# Patient Record
Sex: Female | Born: 1960 | Race: Black or African American | Hispanic: No | Marital: Single | Smoking: Never smoker
Health system: Southern US, Community
[De-identification: ages and names within clinical notes are randomized; demographics above are authoritative.]

## PROBLEM LIST (undated history)

## (undated) DIAGNOSIS — L509 Urticaria, unspecified: Secondary | ICD-10-CM

## (undated) DIAGNOSIS — I8390 Asymptomatic varicose veins of unspecified lower extremity: Secondary | ICD-10-CM

## (undated) DIAGNOSIS — Z780 Asymptomatic menopausal state: Secondary | ICD-10-CM

## (undated) DIAGNOSIS — T7840XA Allergy, unspecified, initial encounter: Secondary | ICD-10-CM

## (undated) DIAGNOSIS — T783XXA Angioneurotic edema, initial encounter: Secondary | ICD-10-CM

## (undated) HISTORY — DX: Asymptomatic menopausal state: Z78.0

## (undated) HISTORY — DX: Urticaria, unspecified: L50.9

## (undated) HISTORY — DX: Angioneurotic edema, initial encounter: T78.3XXA

## (undated) HISTORY — DX: Allergy, unspecified, initial encounter: T78.40XA

## (undated) HISTORY — DX: Asymptomatic varicose veins of unspecified lower extremity: I83.90

---

## 1998-02-16 ENCOUNTER — Ambulatory Visit (HOSPITAL_COMMUNITY): Admission: RE | Admit: 1998-02-16 | Discharge: 1998-02-16 | Payer: Self-pay | Admitting: Internal Medicine

## 1998-09-28 ENCOUNTER — Other Ambulatory Visit: Admission: RE | Admit: 1998-09-28 | Discharge: 1998-09-28 | Payer: Self-pay | Admitting: Internal Medicine

## 1999-01-22 ENCOUNTER — Emergency Department (HOSPITAL_COMMUNITY): Admission: EM | Admit: 1999-01-22 | Discharge: 1999-01-22 | Payer: Self-pay | Admitting: Emergency Medicine

## 1999-09-29 ENCOUNTER — Other Ambulatory Visit: Admission: RE | Admit: 1999-09-29 | Discharge: 1999-09-29 | Payer: Self-pay | Admitting: Internal Medicine

## 2000-11-16 ENCOUNTER — Encounter: Admission: RE | Admit: 2000-11-16 | Discharge: 2000-11-16 | Payer: Self-pay | Admitting: Internal Medicine

## 2000-11-16 ENCOUNTER — Encounter: Payer: Self-pay | Admitting: Internal Medicine

## 2000-11-19 ENCOUNTER — Encounter: Payer: Self-pay | Admitting: Internal Medicine

## 2000-11-19 ENCOUNTER — Ambulatory Visit (HOSPITAL_COMMUNITY): Admission: RE | Admit: 2000-11-19 | Discharge: 2000-11-19 | Payer: Self-pay | Admitting: Internal Medicine

## 2000-12-11 ENCOUNTER — Other Ambulatory Visit: Admission: RE | Admit: 2000-12-11 | Discharge: 2000-12-11 | Payer: Self-pay | Admitting: Internal Medicine

## 2001-10-29 ENCOUNTER — Other Ambulatory Visit: Admission: RE | Admit: 2001-10-29 | Discharge: 2001-10-29 | Payer: Self-pay | Admitting: Family Medicine

## 2002-12-09 ENCOUNTER — Other Ambulatory Visit: Admission: RE | Admit: 2002-12-09 | Discharge: 2002-12-09 | Payer: Self-pay | Admitting: Family Medicine

## 2002-12-15 ENCOUNTER — Encounter: Payer: Self-pay | Admitting: Family Medicine

## 2002-12-15 ENCOUNTER — Encounter: Admission: RE | Admit: 2002-12-15 | Discharge: 2002-12-15 | Payer: Self-pay | Admitting: Family Medicine

## 2004-03-23 ENCOUNTER — Other Ambulatory Visit: Admission: RE | Admit: 2004-03-23 | Discharge: 2004-03-23 | Payer: Self-pay | Admitting: Family Medicine

## 2005-05-01 ENCOUNTER — Other Ambulatory Visit: Admission: RE | Admit: 2005-05-01 | Discharge: 2005-05-01 | Payer: Self-pay | Admitting: Internal Medicine

## 2006-01-18 ENCOUNTER — Encounter: Admission: RE | Admit: 2006-01-18 | Discharge: 2006-01-18 | Payer: Self-pay | Admitting: Internal Medicine

## 2006-02-13 ENCOUNTER — Other Ambulatory Visit: Admission: RE | Admit: 2006-02-13 | Discharge: 2006-02-13 | Payer: Self-pay | Admitting: Internal Medicine

## 2006-10-08 ENCOUNTER — Encounter: Admission: RE | Admit: 2006-10-08 | Discharge: 2006-10-08 | Payer: Self-pay | Admitting: Internal Medicine

## 2007-04-15 ENCOUNTER — Encounter: Admission: RE | Admit: 2007-04-15 | Discharge: 2007-04-15 | Payer: Self-pay | Admitting: Internal Medicine

## 2007-04-15 ENCOUNTER — Other Ambulatory Visit: Admission: RE | Admit: 2007-04-15 | Discharge: 2007-04-15 | Payer: Self-pay | Admitting: Internal Medicine

## 2008-02-12 ENCOUNTER — Encounter: Admission: RE | Admit: 2008-02-12 | Discharge: 2008-02-12 | Payer: Self-pay | Admitting: Internal Medicine

## 2008-02-14 ENCOUNTER — Encounter: Admission: RE | Admit: 2008-02-14 | Discharge: 2008-02-14 | Payer: Self-pay | Admitting: Internal Medicine

## 2008-04-09 ENCOUNTER — Ambulatory Visit: Payer: Self-pay | Admitting: Internal Medicine

## 2008-05-21 ENCOUNTER — Other Ambulatory Visit: Admission: RE | Admit: 2008-05-21 | Discharge: 2008-05-21 | Payer: Self-pay | Admitting: Internal Medicine

## 2008-05-21 ENCOUNTER — Ambulatory Visit: Payer: Self-pay | Admitting: Internal Medicine

## 2008-08-24 ENCOUNTER — Ambulatory Visit: Payer: Self-pay | Admitting: Internal Medicine

## 2008-10-05 ENCOUNTER — Ambulatory Visit: Payer: Self-pay | Admitting: Internal Medicine

## 2009-02-11 ENCOUNTER — Ambulatory Visit: Payer: Self-pay | Admitting: Internal Medicine

## 2009-04-05 ENCOUNTER — Encounter: Admission: RE | Admit: 2009-04-05 | Discharge: 2009-04-05 | Payer: Self-pay | Admitting: Internal Medicine

## 2009-08-02 ENCOUNTER — Ambulatory Visit: Payer: Self-pay | Admitting: Internal Medicine

## 2009-10-15 ENCOUNTER — Ambulatory Visit: Payer: Self-pay | Admitting: Internal Medicine

## 2010-03-07 ENCOUNTER — Ambulatory Visit: Payer: Self-pay | Admitting: Internal Medicine

## 2010-05-06 ENCOUNTER — Encounter: Admission: RE | Admit: 2010-05-06 | Discharge: 2010-05-06 | Payer: Self-pay | Admitting: Internal Medicine

## 2010-05-31 ENCOUNTER — Ambulatory Visit: Payer: Self-pay | Admitting: Internal Medicine

## 2010-05-31 ENCOUNTER — Ambulatory Visit: Admit: 2010-05-31 | Payer: Self-pay | Admitting: Internal Medicine

## 2010-06-27 ENCOUNTER — Encounter: Payer: Self-pay | Admitting: Internal Medicine

## 2010-08-09 ENCOUNTER — Ambulatory Visit (INDEPENDENT_AMBULATORY_CARE_PROVIDER_SITE_OTHER): Payer: BC Managed Care – PPO | Admitting: Internal Medicine

## 2010-08-09 DIAGNOSIS — J309 Allergic rhinitis, unspecified: Secondary | ICD-10-CM

## 2010-08-09 DIAGNOSIS — H6690 Otitis media, unspecified, unspecified ear: Secondary | ICD-10-CM

## 2010-08-09 DIAGNOSIS — J019 Acute sinusitis, unspecified: Secondary | ICD-10-CM

## 2011-01-27 ENCOUNTER — Telehealth: Payer: Self-pay

## 2011-01-27 DIAGNOSIS — N760 Acute vaginitis: Secondary | ICD-10-CM

## 2011-01-27 MED ORDER — FLUCONAZOLE 150 MG PO TABS: 150.0000 mg | ORAL_TABLET | Freq: Once | ORAL | Status: AC

## 2011-01-27 NOTE — Telephone Encounter (Signed)
Patient calls today with suspected yeast infection. Vaginal discharge with itching. Does not want to use OTC antifungals; requests pill given to her in the past. Per verbal order of Dr. Lenord Fellers, Diflucan 150 mg called to [facility]. Patient informed.

## 2011-03-06 ENCOUNTER — Ambulatory Visit (INDEPENDENT_AMBULATORY_CARE_PROVIDER_SITE_OTHER): Payer: BC Managed Care – PPO | Admitting: Internal Medicine

## 2011-03-06 ENCOUNTER — Other Ambulatory Visit: Payer: Self-pay

## 2011-03-06 ENCOUNTER — Encounter: Payer: Self-pay | Admitting: Internal Medicine

## 2011-03-06 VITALS — BP 118/62 | HR 68 | Temp 98.6°F | Ht 64.0 in | Wt 158.0 lb

## 2011-03-06 DIAGNOSIS — B9689 Other specified bacterial agents as the cause of diseases classified elsewhere: Secondary | ICD-10-CM

## 2011-03-06 DIAGNOSIS — N898 Other specified noninflammatory disorders of vagina: Secondary | ICD-10-CM

## 2011-03-06 DIAGNOSIS — A499 Bacterial infection, unspecified: Secondary | ICD-10-CM

## 2011-03-06 DIAGNOSIS — N9489 Other specified conditions associated with female genital organs and menstrual cycle: Secondary | ICD-10-CM

## 2011-03-06 DIAGNOSIS — N909 Noninflammatory disorder of vulva and perineum, unspecified: Secondary | ICD-10-CM

## 2011-03-06 DIAGNOSIS — N760 Acute vaginitis: Secondary | ICD-10-CM

## 2011-03-06 MED ORDER — CLOBETASOL PROPIONATE 0.05 % EX CREA: TOPICAL_CREAM | Freq: Two times a day (BID) | CUTANEOUS | Status: DC

## 2011-03-07 ENCOUNTER — Encounter: Payer: Self-pay | Admitting: Internal Medicine

## 2011-03-07 LAB — GC/CHLAMYDIA PROBE AMP, GENITAL: GC Probe Amp, Genital: NEGATIVE

## 2011-04-05 DIAGNOSIS — B9689 Other specified bacterial agents as the cause of diseases classified elsewhere: Secondary | ICD-10-CM | POA: Insufficient documentation

## 2011-04-05 DIAGNOSIS — N760 Acute vaginitis: Secondary | ICD-10-CM | POA: Insufficient documentation

## 2011-04-05 NOTE — Patient Instructions (Signed)
Take Flagyl twice daily as prescribed. Do not drink alcohol when taking Flagyl. Use Lotrisone cream and genital area twice daily

## 2011-04-05 NOTE — Progress Notes (Signed)
  Subjective:    Patient ID: Carrie Mosley, female    DOB: 1961-04-24, 50 y.o.   MRN: [id number]  HPI patient has irritation in genital area. Describes it as soreness. History of bacterial vaginosis. Some vaginal discharge.    Review of Systems     Objective:   Physical Exam apparent vaginal dryness. Erythema and irritation in labial area and introitus. Slight vaginal discharge. Wet prep reveals clue cells.        Assessment & Plan:  Vaginal dryness  Genital excoriations  Bacterial vaginosis  Plan: Lotrisone cream in genital area twice daily. Flagyl 500 mg by mouth twice daily for 7 days for bacterial vaginosis

## 2011-04-28 ENCOUNTER — Other Ambulatory Visit: Payer: Self-pay | Admitting: Internal Medicine

## 2011-04-28 DIAGNOSIS — Z1231 Encounter for screening mammogram for malignant neoplasm of breast: Secondary | ICD-10-CM

## 2011-05-12 ENCOUNTER — Ambulatory Visit
Admission: RE | Admit: 2011-05-12 | Discharge: 2011-05-12 | Disposition: A | Discharge status: Home / Self Care | Payer: BC Managed Care – PPO | Source: Ambulatory Visit | Attending: Internal Medicine | Admitting: Internal Medicine

## 2011-05-12 DIAGNOSIS — Z1231 Encounter for screening mammogram for malignant neoplasm of breast: Secondary | ICD-10-CM

## 2011-05-16 ENCOUNTER — Other Ambulatory Visit: Payer: Self-pay | Admitting: Internal Medicine

## 2011-05-16 DIAGNOSIS — R928 Other abnormal and inconclusive findings on diagnostic imaging of breast: Secondary | ICD-10-CM

## 2011-05-26 ENCOUNTER — Ambulatory Visit
Admission: RE | Admit: 2011-05-26 | Discharge: 2011-05-26 | Disposition: A | Discharge status: Home / Self Care | Payer: BC Managed Care – PPO | Source: Ambulatory Visit | Attending: Internal Medicine | Admitting: Internal Medicine

## 2011-05-26 DIAGNOSIS — R928 Other abnormal and inconclusive findings on diagnostic imaging of breast: Secondary | ICD-10-CM

## 2011-05-31 ENCOUNTER — Other Ambulatory Visit: Payer: BC Managed Care – PPO

## 2011-06-15 ENCOUNTER — Other Ambulatory Visit: Payer: BC Managed Care – PPO | Admitting: Internal Medicine

## 2011-06-16 ENCOUNTER — Encounter: Payer: Self-pay | Admitting: Internal Medicine

## 2011-06-16 ENCOUNTER — Ambulatory Visit (INDEPENDENT_AMBULATORY_CARE_PROVIDER_SITE_OTHER): Payer: BC Managed Care – PPO | Admitting: Internal Medicine

## 2011-06-16 VITALS — BP 106/74 | HR 72 | Temp 98.9°F | Ht 64.0 in | Wt 158.0 lb

## 2011-06-16 DIAGNOSIS — Z8639 Personal history of other endocrine, nutritional and metabolic disease: Secondary | ICD-10-CM

## 2011-06-16 DIAGNOSIS — Z87898 Personal history of other specified conditions: Secondary | ICD-10-CM

## 2011-06-16 DIAGNOSIS — Z0000 Encounter for general adult medical examination without abnormal findings: Secondary | ICD-10-CM

## 2011-06-16 DIAGNOSIS — Z8669 Personal history of other diseases of the nervous system and sense organs: Secondary | ICD-10-CM

## 2011-06-16 LAB — POCT URINALYSIS DIPSTICK
Blood, UA: NEGATIVE
Ketones, UA: NEGATIVE
Protein, UA: NEGATIVE
Spec Grav, UA: 1.01
Urobilinogen, UA: NEGATIVE

## 2011-06-17 LAB — LIPID PANEL
Cholesterol: 229 mg/dL — ABNORMAL HIGH (ref 0–200)
HDL: 81 mg/dL (ref >39)
Total CHOL/HDL Ratio: 2.8 Ratio
Triglycerides: 80 mg/dL (ref <150)
VLDL: 16 mg/dL (ref 0–40)

## 2011-06-17 LAB — CBC WITH DIFFERENTIAL/PLATELET
Basophils Absolute: 0 10*3/uL (ref 0.0–0.1)
Basophils Relative: 1 % (ref 0–1)
Eosinophils Absolute: 0.1 10*3/uL (ref 0.0–0.7)
Eosinophils Relative: 3 % (ref 0–5)
HCT: 44.6 % (ref 36.0–46.0)
MCH: 27.5 pg (ref 26.0–34.0)
MCHC: 33.2 g/dL (ref 30.0–36.0)
Monocytes Absolute: 0.3 10*3/uL (ref 0.1–1.0)
Neutro Abs: 2.2 10*3/uL (ref 1.7–7.7)
RDW: 13.4 % (ref 11.5–15.5)

## 2011-06-17 LAB — COMPREHENSIVE METABOLIC PANEL
AST: 16 U/L (ref 0–37)
Alkaline Phosphatase: 114 U/L (ref 39–117)
BUN: 14 mg/dL (ref 6–23)
Creat: 0.82 mg/dL (ref 0.50–1.10)
Potassium: 4 mEq/L (ref 3.5–5.3)
Total Bilirubin: 0.6 mg/dL (ref 0.3–1.2)

## 2011-06-23 ENCOUNTER — Encounter: Payer: Self-pay | Admitting: Internal Medicine

## 2011-08-07 ENCOUNTER — Encounter: Payer: Self-pay | Admitting: Internal Medicine

## 2011-08-07 NOTE — Patient Instructions (Signed)
Return in one year or as needed. 

## 2011-08-13 DIAGNOSIS — Z8639 Personal history of other endocrine, nutritional and metabolic disease: Secondary | ICD-10-CM | POA: Insufficient documentation

## 2011-08-13 DIAGNOSIS — Z8669 Personal history of other diseases of the nervous system and sense organs: Secondary | ICD-10-CM | POA: Insufficient documentation

## 2011-08-13 NOTE — Progress Notes (Signed)
  Subjective:    Patient ID: Carrie Mosley, female    DOB: April 20, 1961, 51 y.o.   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 History of kidney stone 1989. History of innocent cardiac murmur. History of occasional migraine headaches. History of vitamin D deficiency. Became menopausal in 2011. History of recurrent bacterial vaginosis. No known drug allergies.  Social history patient rarely drinks alcohol and does not smoke. She lives alone. Has never married. No children. One brother with diabetes. No sisters.  Family history Brother with diabetes. Mother died with apparent septicemia. Father died of heart failure in his 49s. He had diabetes and coronary artery disease.    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 Has seen Dr. Lenoria Farrier April 2012 he suggested that she had eustachian tube dysfunction. Recommended continue with Flonase and Zyrtec-D. Ear exam was negative at that time.  Patient was allergy tested in 2003 and there was no hypersensitivity to a screening panel of allergens. At that time a limited sinus CT was obtained showing no sinus disease.  History of superficial varicosities both legs     Objective:   Physical Exam  Nursing note and vitals reviewed. Constitutional: She is oriented to person, place, and time. She appears well-developed and well-nourished. No distress.  HENT:  Head: Atraumatic.  Right Ear: External ear normal.  Left Ear: External ear normal.  Mouth/Throat: Oropharynx is clear and moist.  Eyes: Conjunctivae and EOM are normal. Pupils are equal, round, and reactive to light. Right eye exhibits no discharge. Left eye exhibits no discharge. No scleral icterus.  Neck: Normal range of motion. Neck supple. No JVD present. No thyromegaly present.  Cardiovascular: Normal rate and regular rhythm.     Murmur heard.      1/6 systolic ejection flow murmur  Pulmonary/Chest: Effort normal and breath sounds normal. She has no wheezes. She has no rales.       Breasts normal female  Abdominal: Soft. Bowel sounds are normal. She exhibits no distension and no mass. There is no tenderness. There is no rebound.  Genitourinary: Vagina normal and uterus normal.       Pap taken  Musculoskeletal: Normal range of motion. She exhibits no edema.       Superficial varicosities both legs  Lymphadenopathy:    She has no cervical adenopathy.  Neurological: She is alert and oriented to person, place, and time. She has normal reflexes. She displays normal reflexes. No cranial nerve deficit. Coordination normal.  Skin: Skin is warm and dry. No rash noted. She is not diaphoretic.  Psychiatric: She has a normal mood and affect. Her behavior is normal.          Assessment & Plan:  History of eustachian tube dysfunction  History of superficial varicosities both legs    History of vitamin D deficiency  History of recurrent bacterial vaginosis  Kidney stone 1989  Plan: Return one year or as needed. Patient had tetanus immunization December 2011

## 2011-08-28 ENCOUNTER — Encounter: Payer: Self-pay | Admitting: Internal Medicine

## 2011-08-28 ENCOUNTER — Ambulatory Visit (INDEPENDENT_AMBULATORY_CARE_PROVIDER_SITE_OTHER): Payer: BC Managed Care – PPO | Admitting: Internal Medicine

## 2011-08-28 DIAGNOSIS — J309 Allergic rhinitis, unspecified: Secondary | ICD-10-CM

## 2011-08-28 DIAGNOSIS — J310 Chronic rhinitis: Secondary | ICD-10-CM

## 2011-08-28 DIAGNOSIS — R202 Paresthesia of skin: Secondary | ICD-10-CM

## 2011-08-28 DIAGNOSIS — R209 Unspecified disturbances of skin sensation: Secondary | ICD-10-CM

## 2011-08-28 DIAGNOSIS — R200 Anesthesia of skin: Secondary | ICD-10-CM

## 2011-08-28 MED ORDER — METHYLPREDNISOLONE ACETATE 80 MG/ML IJ SUSP
80.0000 mg | Freq: Once | INTRAMUSCULAR | Status: AC
  Administered 2011-08-28: 80 mg via INTRAMUSCULAR

## 2011-08-28 NOTE — Progress Notes (Signed)
  Subjective:    Patient ID: Carrie Mosley, female    DOB: Dec 03, 1960, 51 y.o.   MRN: [id number]  HPI Patient complaining of sinus congestion present for several days. Says she blows blood out of her nostril. No fever. 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 Doesn't feel all that well. Also has been having some numbness intermittently in her right hand for some time. Thinks it may have started several months ago when she was cleaning out her mother's house who have passed away. Patient has noticed some numbness and right index and long fingers at times. Sometimes she notices it upon awakening. Couple nights ago she was sitting up address and felt some sort of strange pulling sensation in her right palm. She has full range of motion with her hay and in normal muscle strength. No neck pain. No history of hypertension. She is a black belt in karate but doesn't do karate anymore.    Review of Systems     Objective:   Physical Exam Tinel and Phalen signs are negative. She has an excoriation right lateral nostril. Left nostril is almost completely closed because of boggy nasal mucosa. TMs are clear. Neck supple. Chest clear.        Assessment & Plan:  Allergic rhinitis  Possible right carpal tunnel syndrome  Plan: Patient will be referred to hand surgeon to evaluate problem with right hand. Given Depo-Medrol 80 mg IM in the office today for nasal congestion. Should take Zyrtec daily. Okay to take sick time and stay home for the next 4 days. Time spent with patient 25 minutes

## 2011-08-28 NOTE — Progress Notes (Signed)
Addended by: Judy Pimple on: 08/28/2011 12:14 PM   Modules accepted: Orders

## 2011-08-29 ENCOUNTER — Telehealth: Payer: Self-pay

## 2011-08-29 NOTE — Telephone Encounter (Signed)
Patient scheduled for an appointment with dr. Merlyn Lot on 09/04/2011 at 11:00 am. Patient notified of this.

## 2011-11-20 ENCOUNTER — Encounter: Payer: Self-pay | Admitting: Internal Medicine

## 2011-11-20 ENCOUNTER — Ambulatory Visit (INDEPENDENT_AMBULATORY_CARE_PROVIDER_SITE_OTHER): Payer: BC Managed Care – PPO | Admitting: Internal Medicine

## 2011-11-20 VITALS — BP 118/76 | HR 76 | Temp 98.1°F | Ht 64.75 in | Wt 160.0 lb

## 2011-11-20 DIAGNOSIS — J069 Acute upper respiratory infection, unspecified: Secondary | ICD-10-CM

## 2011-11-20 DIAGNOSIS — N644 Mastodynia: Secondary | ICD-10-CM

## 2011-11-20 NOTE — Progress Notes (Signed)
  Subjective:    Patient ID: Carrie Mosley, female    DOB: 1961/04/03, 51 y.o.   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 First complaint is pain in her right nipple this started after a recent mammogram. Says she feels a "knot" adjacent to her nipple. Also, has acute respiratory infection symptoms that she believes she call from coworker. Has had sore throat but that is better. Now complaining of nasal congestion and sinus pressure. Ears feel a bit stopped. She has a trip planned this week and she's concerned about not being well by then. Explained to her this was a viral syndrome and would take some time to recover. She wants an injection but explained to her that that was not indicated at this point. She had to call in today to be out of work. She'll be off for the remainder of the week.    Review of Systems     Objective:   Physical Exam HEENT exam: TMs are clear bilaterally; sounds nasally congested; pharynx is clear without exudate; neck is supple without adenopathy; chest clear to auscultation. With regard to her right nipple. There is a firmness to the right nipple with associated tenderness to palpation. No mass appreciated behind the nipple. The left nipple is much softer. There is no nipple discharge.        Assessment & Plan:  Nipple pain  URI  Plan: Zithromax Z-Pak take 2 tablets by mouth day one followed by 1 tablet by mouth days 2 through 5. Diflucan 150 mg tablet #1 with refills for recurrent candida vaginitis which occurs on antibiotics. Advised observe nipple firmness for nail. If it persists over the next few weeks, she is to contact the mammogram imaging facility to discuss with them since this apparently started after her mammogram. It is possible she could have some bruising there that will resolve.

## 2011-11-20 NOTE — Patient Instructions (Addendum)
Take Zithromax Z-PAK as corrected. Take Diflucan if Candida vaginitis develops on antibiotic therapy. Try warm hot compresses to right nipple for 20 minutes daily. Call breast imaging Center if symptoms persist.

## 2012-01-12 ENCOUNTER — Ambulatory Visit (INDEPENDENT_AMBULATORY_CARE_PROVIDER_SITE_OTHER): Payer: BC Managed Care – PPO | Admitting: Internal Medicine

## 2012-01-12 ENCOUNTER — Encounter: Payer: Self-pay | Admitting: Cardiology

## 2012-01-12 ENCOUNTER — Encounter: Payer: Self-pay | Admitting: Internal Medicine

## 2012-01-12 VITALS — BP 136/72 | HR 76 | Temp 97.7°F | Ht 64.0 in | Wt 158.0 lb

## 2012-01-12 DIAGNOSIS — R079 Chest pain, unspecified: Secondary | ICD-10-CM

## 2012-01-13 NOTE — Progress Notes (Signed)
  Subjective:    Patient ID: Carrie Mosley, female    DOB: 09-30-1960, 51 y.o.   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 Patient had just eaten a vegetable dish and was lying down on the bed and experienced severe left chest pain. It did not radiate to neck or down her left arm. She had no diaphoresis nausea or vomiting. She could not work. Felt as if she might be having reflux. Noticed some left chest wall tenderness. Was very scared. Says that her father had history of heart problems as does her brother. Father died with heart failure , history of MI, but had a history of diabetes. Brother with history of diabetes. Mother died with complications of GYN malignancy. Patient works out several times a week. She has a black belt in karate but doesn't perform karate much anymore. She has noticed that her suitcase has been heavy lately and she has to lift it up over her head to put it in a bin when flying. No shortness of breath.    Review of Systems     Objective:   Physical Exam skin is warm and dry; neck is supple without JVD thyromegaly or carotid bruits. Chest is clear to auscultation without rales or wheezing. Cardiac exam regular rate and rhythm normal S1 and S2. Chest wall without deformity but has palpable left parasternal chest wall tenderness. Abdomen no hepatosplenomegaly masses or tenderness. Extremities without edema. Is alert and oriented x3.  EKG is unchanged from previous EKG done December 2011 at time of physical exam when she was 51 years old and had asked for EKG to be done at that time. She is tearful in the office today clearly quite worried. Pulse oximetry is 99% on room air        Assessment & Plan:  Chest wall pain  Anxiety  Plan: Arrange for patient to see cardiologist for further evaluation. Patient given Celebrex  200 mg samples to take daily for 10 days for chest wall pain. Patient reassured this is unlikely to be cardiac pain.

## 2012-01-13 NOTE — Patient Instructions (Addendum)
Takes Celebrex 200 mg daily for 10 days. Apply ice to chest wall. We will arrange for you to see cardiologist for a evaluation. This is unlikely to be cardiac chest pain.

## 2012-02-08 ENCOUNTER — Encounter: Payer: Self-pay | Admitting: Cardiology

## 2012-02-08 ENCOUNTER — Ambulatory Visit (INDEPENDENT_AMBULATORY_CARE_PROVIDER_SITE_OTHER): Payer: BC Managed Care – PPO | Admitting: Cardiology

## 2012-02-08 VITALS — BP 120/76 | HR 70 | Ht 64.0 in | Wt 160.0 lb

## 2012-02-08 DIAGNOSIS — R0789 Other chest pain: Secondary | ICD-10-CM | POA: Insufficient documentation

## 2012-02-08 NOTE — Patient Instructions (Signed)
Continue to treat your chest pain with rest and anti-inflammatory meds as needed ( advil, aleve )  You may resume aerobic activity but try and limit weight lifting until symptoms resolved.

## 2012-02-08 NOTE — Progress Notes (Signed)
Carrie Mosley Date of Birth: Mar 13, 1961 Medical Record #[id number]  History of Present Illness: Carrie Mosley is seen at the request of Dr. Lenord Fellers for evaluation of chest pain. 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 Earlier this month while staying in a hotel in [place] she developed acute sharp midsternal chest pain. She initially thought this was indigestion because she had [place] food to eat that night. She tried taking TUMS without relief. She then took Advil with improvement. Her pain is described as sharp and grabbing. It had no radiation. She denied any shortness of breath or palpitations. The following morning her chest is very sore. The next night her pain seemed to be more aggravated when she was lying down. Her pain was worse with movement but not with deep breathing. She has no history of diabetes, hypertension, hypercholesterolemia. She denies any history of tobacco use. She took 2 weeks off in her chest pain seemed to improve. When she went back to work she did note some aggravation but not as severe as before. Current Outpatient Prescriptions on File Prior to Visit  Medication Sig Dispense Refill  . cholecalciferol (VITAMIN D) 1000 UNITS tablet Take 1,000 Units by mouth daily.      . fluticasone (FLONASE) 50 MCG/ACT nasal spray Place into the nose daily.       Marland Kitchen ibuprofen (ADVIL,MOTRIN) 200 MG tablet Take 200 mg by mouth every 6 (six) hours as needed.        No Known Allergies  Past Medical History  Diagnosis Date  . Migraine   . Menopause   . Vitamin d deficiency     History reviewed. No pertinent past surgical history.  History  Smoking status  . Never Smoker   Smokeless tobacco  . Not on file    History  Alcohol Use: Not on file    No family history on file.  Review of Systems: The review of systems is positive for chest pain as above. She is menopausal. All other systems were reviewed and are negative.  Physical Exam: BP  120/76  Pulse 70  Ht 5\' 4"  (1.626 m)  Wt 72.576 kg (160 lb)  BMI 27.46 kg/m2  LMP 06/05/2010 She is a pleasant female in no acute distress.The patient is alert and oriented x 3.  The mood and affect are normal.  The skin is warm and dry.  Color is normal.  The HEENT exam reveals that the sclera are nonicteric.  The mucous membranes are moist.  The carotids are 2+ without bruits.  There is no thyromegaly.  There is no JVD.  The lungs are clear.  The chest wall is non tender.  The heart exam reveals a regular rate with a normal S1 and S2.  There are no murmurs, gallops, or rubs.  The PMI is not displaced.   Abdominal exam reveals good bowel sounds.  There is no guarding or rebound.  There is no hepatosplenomegaly or tenderness.  There are no masses.  Exam of the legs reveal no clubbing, cyanosis, or edema.  The legs are without rashes.  The distal pulses are intact.  Cranial nerves II - XII are intact.  Motor and sensory functions are intact.  The gait is normal.  LABORATORY DATA: ECG demonstrates normal sinus rhythm with a rate of 71 beats per minute. It is normal.  Assessment / Plan: 1. Musculoskeletal chest pain. Her only risk factor for cardiac disease as her family history. Her history and physical  were consistent with more of a musculoskeletal pain. I reassured her concerning these findings. I do not feel that further testing is indicated. I would recommend she continue to avoid lifting until her pain resolves and use anti-inflammatory medication such as Advil or or Aleve as needed. She may resume normal aerobic activity.

## 2012-05-17 ENCOUNTER — Other Ambulatory Visit: Payer: Self-pay | Admitting: Internal Medicine

## 2012-05-17 DIAGNOSIS — Z1231 Encounter for screening mammogram for malignant neoplasm of breast: Secondary | ICD-10-CM

## 2012-05-30 NOTE — Addendum Note (Signed)
Addended by: Judy Pimple on: 05/30/2012 03:30 PM   Modules accepted: Orders

## 2012-06-10 ENCOUNTER — Ambulatory Visit (INDEPENDENT_AMBULATORY_CARE_PROVIDER_SITE_OTHER): Payer: BC Managed Care – PPO | Admitting: Internal Medicine

## 2012-06-10 ENCOUNTER — Encounter: Payer: Self-pay | Admitting: Internal Medicine

## 2012-06-10 VITALS — BP 116/62 | HR 80 | Temp 98.1°F | Wt 160.0 lb

## 2012-06-10 DIAGNOSIS — H6692 Otitis media, unspecified, left ear: Secondary | ICD-10-CM

## 2012-06-10 DIAGNOSIS — H6690 Otitis media, unspecified, unspecified ear: Secondary | ICD-10-CM

## 2012-06-10 DIAGNOSIS — J329 Chronic sinusitis, unspecified: Secondary | ICD-10-CM

## 2012-06-10 MED ORDER — METHYLPREDNISOLONE ACETATE 80 MG/ML IJ SUSP
80.0000 mg | Freq: Once | INTRAMUSCULAR | Status: AC
  Administered 2012-06-10: 80 mg via INTRAMUSCULAR

## 2012-06-10 NOTE — Patient Instructions (Addendum)
Take Zithromax Z-PAK 2 tabs day one followed by 1 tablet days 2 through 5. If yeast infection develops, take Diflucan 150 mg tablet one time dose. Get influenza vaccine at pharmacy

## 2012-06-10 NOTE — Progress Notes (Signed)
  Subjective:    Patient ID: Carrie Mosley, female    DOB: [birth year], 52 y.o.   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 It began to get some discolored nasal drainage. Ears feel a bit stuffy and she is fatigued. No fever or chills. Has not had influenza vaccine yet. Suggested she go to pharmacy since we are out.     Review of Systems     Objective:   Physical Exam HEENT exam: Left TM is dull but not red. Right TM clear. Pharynx is clear. Boggy nasal mucosa bilaterally. Neck is supple without adenopathy. Chest is clear.        Assessment & Plan:  Left otitis media  Sinusitis  Plan: Depo-Medrol 80 mg IM. Zithromax Z-PAK take 2 tablets day one followed by 1 tablet days 2 through 5. Diflucan 150 mg tablet should yeast infection develop while on antibiotics.  Also, tells me that brother has had to have above-the-knee amputation because of diabetes complications.Marland Kitchen He may also need kidney and heart transplants. She is worried she may be asked to become a donor for kidney. We had a discussion about this today for 15 minutes.

## 2012-06-20 ENCOUNTER — Ambulatory Visit
Admission: RE | Admit: 2012-06-20 | Discharge: 2012-06-20 | Disposition: A | Discharge status: Home / Self Care | Payer: BC Managed Care – PPO | Source: Ambulatory Visit | Attending: Internal Medicine | Admitting: Internal Medicine

## 2012-06-20 DIAGNOSIS — Z1231 Encounter for screening mammogram for malignant neoplasm of breast: Secondary | ICD-10-CM

## 2012-07-19 ENCOUNTER — Other Ambulatory Visit: Payer: BC Managed Care – PPO | Admitting: Internal Medicine

## 2012-07-19 ENCOUNTER — Encounter: Payer: BC Managed Care – PPO | Admitting: Internal Medicine

## 2013-01-30 ENCOUNTER — Encounter: Payer: BC Managed Care – PPO | Admitting: Internal Medicine

## 2013-01-30 ENCOUNTER — Other Ambulatory Visit: Payer: Self-pay | Admitting: Internal Medicine

## 2013-02-10 ENCOUNTER — Other Ambulatory Visit: Payer: BC Managed Care – PPO | Admitting: Internal Medicine

## 2013-02-10 ENCOUNTER — Encounter: Payer: Self-pay | Admitting: Internal Medicine

## 2013-02-10 ENCOUNTER — Ambulatory Visit (INDEPENDENT_AMBULATORY_CARE_PROVIDER_SITE_OTHER): Payer: BC Managed Care – PPO | Admitting: Internal Medicine

## 2013-02-10 VITALS — BP 122/66 | HR 72 | Temp 98.1°F | Ht 64.25 in | Wt 164.0 lb

## 2013-02-10 DIAGNOSIS — I83893 Varicose veins of bilateral lower extremities with other complications: Secondary | ICD-10-CM

## 2013-02-10 DIAGNOSIS — R232 Flushing: Secondary | ICD-10-CM

## 2013-02-10 DIAGNOSIS — J069 Acute upper respiratory infection, unspecified: Secondary | ICD-10-CM

## 2013-02-10 DIAGNOSIS — Z8639 Personal history of other endocrine, nutritional and metabolic disease: Secondary | ICD-10-CM

## 2013-02-10 DIAGNOSIS — Z130 Encounter for screening for diseases of the blood and blood-forming organs and certain disorders involving the immune mechanism: Secondary | ICD-10-CM

## 2013-02-10 DIAGNOSIS — Z13220 Encounter for screening for lipoid disorders: Secondary | ICD-10-CM

## 2013-02-10 DIAGNOSIS — N951 Menopausal and female climacteric states: Secondary | ICD-10-CM

## 2013-02-10 DIAGNOSIS — Z0000 Encounter for general adult medical examination without abnormal findings: Secondary | ICD-10-CM

## 2013-02-10 DIAGNOSIS — Z1329 Encounter for screening for other suspected endocrine disorder: Secondary | ICD-10-CM

## 2013-02-10 LAB — POCT URINALYSIS DIPSTICK
Bilirubin, UA: NEGATIVE
Blood, UA: NEGATIVE
Glucose, UA: NEGATIVE
Ketones, UA: NEGATIVE
Nitrite, UA: NEGATIVE
Spec Grav, UA: 1.01
pH, UA: 6

## 2013-02-10 LAB — CBC WITH DIFFERENTIAL/PLATELET
Eosinophils Absolute: 0.1 10*3/uL (ref 0.0–0.7)
Eosinophils Relative: 4 % (ref 0–5)
HCT: 40.8 % (ref 36.0–46.0)
Lymphs Abs: 1 10*3/uL (ref 0.7–4.0)
MCH: 27.7 pg (ref 26.0–34.0)
MCV: 80.2 fL (ref 78.0–100.0)
Monocytes Absolute: 0.3 10*3/uL (ref 0.1–1.0)
Platelets: 207 10*3/uL (ref 150–400)
RDW: 13.9 % (ref 11.5–15.5)

## 2013-02-10 LAB — COMPREHENSIVE METABOLIC PANEL
ALT: 10 U/L (ref 0–35)
BUN: 12 mg/dL (ref 6–23)
CO2: 28 mEq/L (ref 19–32)
Calcium: 9.3 mg/dL (ref 8.4–10.5)
Chloride: 103 mEq/L (ref 96–112)
Creat: 0.71 mg/dL (ref 0.50–1.10)
Total Bilirubin: 0.5 mg/dL (ref 0.3–1.2)

## 2013-02-10 LAB — LIPID PANEL
Cholesterol: 171 mg/dL (ref 0–200)
HDL: 68 mg/dL (ref >39)
Total CHOL/HDL Ratio: 2.5 Ratio
Triglycerides: 54 mg/dL (ref <150)
VLDL: 11 mg/dL (ref 0–40)

## 2013-02-10 MED ORDER — FLUCONAZOLE 150 MG PO TABS: 150.0000 mg | ORAL_TABLET | Freq: Once | ORAL | Status: DC

## 2013-02-10 MED ORDER — AZITHROMYCIN 250 MG PO TABS: ORAL_TABLET | ORAL | Status: DC

## 2013-02-10 MED ORDER — FLUTICASONE PROPIONATE 50 MCG/ACT NA SUSP: 2.0000 | Freq: Every day | NASAL | Status: DC

## 2013-02-11 LAB — VITAMIN D 25 HYDROXY (VIT D DEFICIENCY, FRACTURES): Vit D, 25-Hydroxy: 52 ng/mL (ref 30–89)

## 2013-02-26 ENCOUNTER — Encounter: Payer: Self-pay | Admitting: Internal Medicine

## 2013-02-26 NOTE — Progress Notes (Signed)
Subjective:    Patient ID: Carrie Mosley, female    DOB: 1961/05/04, 52 y.o.   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  Today, she has an acute respiratory infection with scratchy throat onset last week. Has had cough sneezing and nasal congestion. No fever.  She also has multiple lower extremity varicosities mostly superficial that are unsightly to her and at times tender. Says mother had varicose veins.  History of vitamin D deficiency and currently is not taking vitamin D supplement.  Became menopausal in 2011 and has hot flashes at times.  History of recurrent bacterial vaginosis. History of occasional migraine headaches.  History of kidney stone 1989.  History of innocent cardiac murmur.  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 She has seen Dr. Lenoria Farrier at [facility] April 2012. He thought she had eustachian tube dysfunction. He recommended Flonase and Zyrtec-D. She was allergy tested in 2003 and there was no hypersensitivity to a screening panel of allergens. At that time a limited CT of the sinuses was obtained showing no sinus disease.  Social history: Single, never married, no children.  Patient rarely drinks alcohol and does not smoke. Patient holds a Corporate investment banker in Doctor, general practice.  Family history: Brother with diabetes. No sisters. Mother died with apparent septicemia. Father died of heart failure in his 66s. He had diabetes and coronary artery disease.    Review of Systems  Constitutional: Negative.        Complains of inability to lose weight  HENT: Positive for congestion and sneezing.   Eyes: Negative.   Respiratory: Positive for cough.   Endocrine:       Hot flashes  Genitourinary: Negative.   Allergic/Immunologic: Negative.   Neurological: Negative.   Hematological:  Negative.   Psychiatric/Behavioral: Negative.        Objective:   Physical Exam  Vitals reviewed. Constitutional: She is oriented to person, place, and time. She appears well-developed and well-nourished. No distress.  HENT:  Head: Normocephalic and atraumatic.  Right Ear: External ear normal.  Left Ear: External ear normal.  Mouth/Throat: Oropharynx is clear and moist. No oropharyngeal exudate.  Eyes: Conjunctivae and EOM are normal. Pupils are equal, round, and reactive to light. Right eye exhibits no discharge. Left eye exhibits no discharge. No scleral icterus.  Neck: Neck supple. No JVD present. No thyromegaly present.  Cardiovascular: Normal rate, regular rhythm, normal heart sounds and intact distal pulses.   No murmur heard. Pulmonary/Chest: Effort normal and breath sounds normal. No respiratory distress. She has no rales. She exhibits no tenderness.  Breasts normal female without masses  Abdominal: Soft. Bowel sounds are normal. She exhibits no distension and no mass. There is no tenderness. There is no rebound and no guarding.  Genitourinary:  Deferred to GYN  Musculoskeletal: Normal range of motion. She exhibits no edema.  Lymphadenopathy:    She has no cervical adenopathy.  Neurological: She is alert and oriented to person, place, and time. She has normal reflexes. No cranial nerve deficit. Coordination normal.  Skin: Skin is warm and dry. No rash noted. She is not diaphoretic.  Multiple superficial varicosities both lower extremities. No erythema.  Psychiatric: She has a normal mood and affect. Her behavior is normal. Judgment and thought content normal.          Assessment & Plan:  Acute URI  Superficial varicosities both lower tremor these that are symptomatic at times with her job as a Financial controller. Patient wants to seek treatment. Refer to vascular surgeon.  History of vitamin D deficiency. Currently not taking vitamin D supplement.  History of  eustachian tube dysfunction.  History of bacterial vaginosis.  Plan: Patient is due for colonoscopy. For URI, given Zithromax Z-Pak. Refer to vascular surgeon regarding superficial varicosities. Restart vitamin D supplement. Return in one year or as needed.

## 2013-02-26 NOTE — Patient Instructions (Addendum)
Refer to vascular surgeon for treatment of superficial varicosities. Take antibiotics as prescribed for respiratory infection. Colonoscopy due.

## 2013-03-24 ENCOUNTER — Encounter: Payer: Self-pay | Admitting: Internal Medicine

## 2013-04-02 ENCOUNTER — Ambulatory Visit (INDEPENDENT_AMBULATORY_CARE_PROVIDER_SITE_OTHER): Payer: BC Managed Care – PPO | Admitting: Internal Medicine

## 2013-04-02 ENCOUNTER — Telehealth: Payer: Self-pay | Admitting: *Deleted

## 2013-04-02 DIAGNOSIS — Z23 Encounter for immunization: Secondary | ICD-10-CM

## 2013-04-02 DIAGNOSIS — I8390 Asymptomatic varicose veins of unspecified lower extremity: Secondary | ICD-10-CM

## 2013-04-02 NOTE — Telephone Encounter (Signed)
Spoke with Olegario Messier at Dr. Candie Chroman office, referral entered & they will contact patient for appointment.

## 2013-04-04 ENCOUNTER — Telehealth: Payer: Self-pay | Admitting: Internal Medicine
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
## 2013-06-10 ENCOUNTER — Ambulatory Visit (INDEPENDENT_AMBULATORY_CARE_PROVIDER_SITE_OTHER): Payer: BC Managed Care – PPO | Admitting: Internal Medicine

## 2013-06-10 ENCOUNTER — Encounter: Payer: Self-pay | Admitting: Internal Medicine

## 2013-06-10 VITALS — BP 128/68 | HR 76 | Temp 99.2°F | Wt 164.0 lb

## 2013-06-10 DIAGNOSIS — M542 Cervicalgia: Secondary | ICD-10-CM

## 2013-06-16 ENCOUNTER — Ambulatory Visit
Admission: RE | Admit: 2013-06-16 | Discharge: 2013-06-16 | Disposition: A | Discharge status: Home / Self Care | Payer: BC Managed Care – PPO | Source: Ambulatory Visit | Attending: Internal Medicine | Admitting: Internal Medicine

## 2013-06-16 NOTE — Progress Notes (Signed)
Patient informed. 

## 2013-07-01 ENCOUNTER — Other Ambulatory Visit: Payer: Self-pay

## 2013-07-01 DIAGNOSIS — Z1231 Encounter for screening mammogram for malignant neoplasm of breast: Secondary | ICD-10-CM

## 2013-07-17 ENCOUNTER — Ambulatory Visit
Admission: RE | Admit: 2013-07-17 | Discharge: 2013-07-17 | Disposition: A | Discharge status: Home / Self Care | Payer: BC Managed Care – PPO | Source: Ambulatory Visit

## 2013-07-17 ENCOUNTER — Ambulatory Visit: Payer: BC Managed Care – PPO

## 2013-07-17 DIAGNOSIS — Z1231 Encounter for screening mammogram for malignant neoplasm of breast: Secondary | ICD-10-CM

## 2013-08-09 ENCOUNTER — Telehealth: Payer: Self-pay | Admitting: Internal Medicine

## 2013-08-09 NOTE — Telephone Encounter (Signed)
Patient in today with URI symptoms on Sunday, March 1. His been sick all of this week. Has had sore throat cough and congestion. No fever or shaking chills.: Zithromax Z-Pak with one refill  To [facility] [address] along with Diflucan 150 mg tablet with one refill. Leave written prescription for Hycodan syrup 4 ounces 1 teaspoon by mouth every 6 hours when necessary cough and office for patient to pick up Saturday.

## 2013-08-19 ENCOUNTER — Ambulatory Visit (INDEPENDENT_AMBULATORY_CARE_PROVIDER_SITE_OTHER): Payer: BC Managed Care – PPO | Admitting: Internal Medicine

## 2013-08-19 ENCOUNTER — Encounter: Payer: Self-pay | Admitting: Internal Medicine

## 2013-08-19 VITALS — BP 112/74 | HR 76 | Temp 99.2°F | Wt 164.0 lb

## 2013-08-19 DIAGNOSIS — R599 Enlarged lymph nodes, unspecified: Secondary | ICD-10-CM

## 2013-08-19 NOTE — Addendum Note (Signed)
Addended by: Judy PimpleEILAND, Edwar Coe M on: 08/19/2013 12:40 PM   Modules accepted: Orders

## 2013-08-19 NOTE — Patient Instructions (Signed)
Call if lymph node does not resolve in 3 weeks. Monospot test drawn.
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
## 2013-08-20 LAB — MONONUCLEOSIS SCREEN: Mono Screen: NEGATIVE

## 2013-08-21 NOTE — Progress Notes (Signed)
Patient informed. 

## 2013-12-07 NOTE — Patient Instructions (Addendum)
Patient to have MRI of the C-spine. Continue with Skelaxin for musculoskeletal pain. Continue to be followed by company physician.
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
## 2014-02-19 ENCOUNTER — Ambulatory Visit (INDEPENDENT_AMBULATORY_CARE_PROVIDER_SITE_OTHER): Payer: BC Managed Care – PPO | Admitting: Internal Medicine

## 2014-02-19 ENCOUNTER — Encounter: Payer: Self-pay | Admitting: Internal Medicine

## 2014-02-19 VITALS — BP 120/80 | HR 68 | Temp 98.2°F | Ht 64.25 in | Wt 163.0 lb

## 2014-02-19 DIAGNOSIS — H6500 Acute serous otitis media, unspecified ear: Secondary | ICD-10-CM

## 2014-02-19 DIAGNOSIS — H6502 Acute serous otitis media, left ear: Secondary | ICD-10-CM

## 2014-02-19 MED ORDER — NEOMYCIN-COLIST-HC-THONZONIUM 3.3-3-10-0.5 MG/ML OT SUSP: 4.0000 [drp] | Freq: Four times a day (QID) | OTIC | Status: DC

## 2014-02-19 NOTE — Progress Notes (Signed)
   Subjective:    Patient ID: Carrie Mosley, female    DOB: Jan 05, 1961, 53 y.o.   MRN: [id number]  HPI  Patient is a flight attendant. Complaining today of headache and ear pain. No fever or chills. No real respiratory congestion just earache. Patient has history of ear problems for a number of years.    Review of Systems     Objective:   Physical Exam  Left external ear canal is red Pharynx is clear. Neck supple. Chest clear      Assessment & Plan:  Acute left  Otitis externa  Plan: Cortisporin Otic suspension to use in left external ear canal 4 times daily for 5-7 days. Advil as needed for headache.

## 2014-03-09 DIAGNOSIS — M25519 Pain in unspecified shoulder: Secondary | ICD-10-CM | POA: Insufficient documentation

## 2014-04-10 ENCOUNTER — Other Ambulatory Visit (INDEPENDENT_AMBULATORY_CARE_PROVIDER_SITE_OTHER): Payer: BC Managed Care – PPO | Admitting: Internal Medicine

## 2014-04-10 ENCOUNTER — Encounter: Payer: Self-pay | Admitting: Internal Medicine

## 2014-04-10 ENCOUNTER — Ambulatory Visit (INDEPENDENT_AMBULATORY_CARE_PROVIDER_SITE_OTHER): Payer: BC Managed Care – PPO | Admitting: Internal Medicine

## 2014-04-10 VITALS — BP 110/74 | HR 87 | Temp 97.3°F | Ht 64.0 in | Wt 164.0 lb

## 2014-04-10 DIAGNOSIS — K529 Noninfective gastroenteritis and colitis, unspecified: Secondary | ICD-10-CM

## 2014-04-10 DIAGNOSIS — Z8639 Personal history of other endocrine, nutritional and metabolic disease: Secondary | ICD-10-CM

## 2014-04-10 DIAGNOSIS — Z1329 Encounter for screening for other suspected endocrine disorder: Secondary | ICD-10-CM

## 2014-04-10 DIAGNOSIS — Z13220 Encounter for screening for lipoid disorders: Secondary | ICD-10-CM

## 2014-04-10 DIAGNOSIS — I8393 Asymptomatic varicose veins of bilateral lower extremities: Secondary | ICD-10-CM

## 2014-04-10 DIAGNOSIS — Z0000 Encounter for general adult medical examination without abnormal findings: Secondary | ICD-10-CM

## 2014-04-10 DIAGNOSIS — Z8669 Personal history of other diseases of the nervous system and sense organs: Secondary | ICD-10-CM

## 2014-04-10 DIAGNOSIS — Z23 Encounter for immunization: Secondary | ICD-10-CM

## 2014-04-10 DIAGNOSIS — Z130 Encounter for screening for diseases of the blood and blood-forming organs and certain disorders involving the immune mechanism: Secondary | ICD-10-CM

## 2014-04-10 LAB — POCT URINALYSIS DIPSTICK
BILIRUBIN UA: NEGATIVE
Blood, UA: NEGATIVE
GLUCOSE UA: NEGATIVE
Ketones, UA: NEGATIVE
LEUKOCYTES UA: NEGATIVE
NITRITE UA: NEGATIVE
Protein, UA: NEGATIVE
Spec Grav, UA: 1.01
Urobilinogen, UA: NEGATIVE
pH, UA: 7

## 2014-04-10 LAB — CBC WITH DIFFERENTIAL/PLATELET
Basophils Absolute: 0 10*3/uL (ref 0.0–0.1)
Basophils Relative: 0 % (ref 0–1)
Eosinophils Absolute: 0.1 10*3/uL (ref 0.0–0.7)
Eosinophils Relative: 3 % (ref 0–5)
HCT: 43.7 % (ref 36.0–46.0)
Hemoglobin: 14.6 g/dL (ref 12.0–15.0)
LYMPHS PCT: 31 % (ref 12–46)
Lymphs Abs: 1.1 10*3/uL (ref 0.7–4.0)
MCH: 27 pg (ref 26.0–34.0)
MCHC: 33.4 g/dL (ref 30.0–36.0)
MCV: 80.8 fL (ref 78.0–100.0)
Monocytes Absolute: 0.3 10*3/uL (ref 0.1–1.0)
Monocytes Relative: 8 % (ref 3–12)
NEUTROS PCT: 58 % (ref 43–77)
Neutro Abs: 2 10*3/uL (ref 1.7–7.7)
PLATELETS: 207 10*3/uL (ref 150–400)
RBC: 5.41 MIL/uL — AB (ref 3.87–5.11)
RDW: 14.3 % (ref 11.5–15.5)
WBC: 3.5 10*3/uL — AB (ref 4.0–10.5)

## 2014-04-10 LAB — COMPREHENSIVE METABOLIC PANEL
ALT: 11 U/L (ref 0–35)
AST: 16 U/L (ref 0–37)
Albumin: 4.3 g/dL (ref 3.5–5.2)
Alkaline Phosphatase: 107 U/L (ref 39–117)
BUN: 11 mg/dL (ref 6–23)
CALCIUM: 9.5 mg/dL (ref 8.4–10.5)
CHLORIDE: 103 meq/L (ref 96–112)
CO2: 23 meq/L (ref 19–32)
Creat: 0.81 mg/dL (ref 0.50–1.10)
Glucose, Bld: 84 mg/dL (ref 70–99)
Potassium: 3.7 mEq/L (ref 3.5–5.3)
SODIUM: 138 meq/L (ref 135–145)
Total Bilirubin: 1 mg/dL (ref 0.2–1.2)
Total Protein: 7.4 g/dL (ref 6.0–8.3)

## 2014-04-10 LAB — LIPID PANEL
CHOLESTEROL: 217 mg/dL — AB (ref 0–200)
HDL: 66 mg/dL (ref >39)
LDL Cholesterol: 127 mg/dL — ABNORMAL HIGH (ref 0–99)
Total CHOL/HDL Ratio: 3.3 Ratio
Triglycerides: 121 mg/dL (ref <150)
VLDL: 24 mg/dL (ref 0–40)

## 2014-04-10 LAB — TSH: TSH: 0.965 u[IU]/mL (ref 0.350–4.500)

## 2014-04-10 MED ORDER — CIPROFLOXACIN HCL 500 MG PO TABS: 500.0000 mg | ORAL_TABLET | Freq: Two times a day (BID) | ORAL | Status: DC

## 2014-04-10 MED ORDER — FLUTICASONE PROPIONATE 50 MCG/ACT NA SUSP: 2.0000 | Freq: Every day | NASAL | Status: DC

## 2014-04-11 LAB — VITAMIN D 25 HYDROXY (VIT D DEFICIENCY, FRACTURES): Vit D, 25-Hydroxy: 48 ng/mL (ref 30–89)

## 2014-04-14 ENCOUNTER — Encounter: Payer: Self-pay | Admitting: Internal Medicine

## 2014-04-26 NOTE — Patient Instructions (Signed)
Use Advil as needed for headache. Use Cortisporin otic suspension his left external ear canal 4 times daily for 5-7 days

## 2014-04-29 ENCOUNTER — Other Ambulatory Visit: Payer: Self-pay | Admitting: Internal Medicine

## 2014-04-29 NOTE — Telephone Encounter (Signed)
Patient is complaining of yeast infection from taking cipro.  She request diflucan refill.  Per Dr Lenord FellersBaxley refill ok.  [facility].  Patient aware.

## 2014-05-04 ENCOUNTER — Other Ambulatory Visit: Payer: Self-pay

## 2014-05-04 DIAGNOSIS — Z1231 Encounter for screening mammogram for malignant neoplasm of breast: Secondary | ICD-10-CM

## 2014-07-12 ENCOUNTER — Encounter: Payer: Self-pay | Admitting: Internal Medicine
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
## 2014-07-12 NOTE — Patient Instructions (Signed)
Return in one year or as needed. Take Cipro 500 mg twice daily for 3 days for gastroenteritis. Flonase refill for one year. Take vitamin D supplement.

## 2014-08-04 ENCOUNTER — Other Ambulatory Visit: Payer: Self-pay

## 2014-08-04 DIAGNOSIS — Z1231 Encounter for screening mammogram for malignant neoplasm of breast: Secondary | ICD-10-CM

## 2014-08-07 ENCOUNTER — Ambulatory Visit
Admission: RE | Admit: 2014-08-07 | Discharge: 2014-08-07 | Disposition: A | Discharge status: Home / Self Care | Payer: BLUE CROSS/BLUE SHIELD | Source: Ambulatory Visit

## 2014-08-07 DIAGNOSIS — Z1231 Encounter for screening mammogram for malignant neoplasm of breast: Secondary | ICD-10-CM

## 2014-09-04 ENCOUNTER — Encounter: Payer: Self-pay | Admitting: Internal Medicine

## 2014-09-04 ENCOUNTER — Ambulatory Visit (INDEPENDENT_AMBULATORY_CARE_PROVIDER_SITE_OTHER): Payer: BLUE CROSS/BLUE SHIELD | Admitting: Internal Medicine

## 2014-09-04 VITALS — BP 124/72 | HR 83 | Temp 98.2°F | Wt 165.0 lb

## 2014-09-04 DIAGNOSIS — F4321 Adjustment disorder with depressed mood: Secondary | ICD-10-CM

## 2014-09-04 DIAGNOSIS — R490 Dysphonia: Secondary | ICD-10-CM | POA: Diagnosis not present

## 2014-09-04 DIAGNOSIS — J069 Acute upper respiratory infection, unspecified: Secondary | ICD-10-CM

## 2014-09-04 MED ORDER — HYDROCODONE-HOMATROPINE 5-1.5 MG/5ML PO SYRP: 5.0000 mL | ORAL_SOLUTION | Freq: Three times a day (TID) | ORAL | Status: DC | PRN

## 2014-09-04 MED ORDER — AZITHROMYCIN 250 MG PO TABS: ORAL_TABLET | ORAL | Status: DC

## 2014-09-04 MED ORDER — FLUCONAZOLE 150 MG PO TABS: 150.0000 mg | ORAL_TABLET | Freq: Once | ORAL | Status: DC

## 2014-09-04 MED ORDER — METHYLPREDNISOLONE ACETATE 80 MG/ML IJ SUSP
80.0000 mg | Freq: Once | INTRAMUSCULAR | Status: AC
  Administered 2014-09-04: 80 mg via INTRAMUSCULAR

## 2014-09-04 NOTE — Progress Notes (Signed)
   Subjective:    Patient ID: Carrie SchwabFelicia Mosley, female    DOB: 10/10/1960, 54 y.o.   MRN: [id number]  HPI  Patient has been ill for about a week with URI symptoms. His been hoarse and congested. 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 Has several overnights on upcoming trip. Has had cough and congestion. No sore throat. No fever or shaking chills. Also, having grief reaction. Her brother died in January at 52 years of age due to congestive heart failure complications. He was a diabetic and a double amputee. She has no other siblings left. Her parents are deceased. She's feeling lonely.    Review of Systems     Objective:   Physical Exam  Skin warm and dry. Nodes none. TMs are clear. Pharynx is clear. Neck is supple. She is hoarse when she speaks. Chest clear to auscultation without rales or wheezing.      Assessment & Plan:  Spent 25 minutes seeing her for acute URI and discussing grief reaction with her. I do not think she needs to be on antidepressant medication at this point.  Acute URI  Hoarseness  Grief reaction-due to death of brother  Plan: Zithromax Z-Pak 2 tablets by mouth day one followed by 1 tablet days 2 through 5 with 1 refill. If not better in 7 days have prescription refilled. Diflucan if needed for Candida vaginitis. Depo-Medrol 80 mg IM given today.

## 2014-09-04 NOTE — Patient Instructions (Signed)
Take hydrocodone cough syrup and Zithromax as directed. If not better in 7 days have Zithromax refilled. Depo-Medrol given today.

## 2014-09-14 ENCOUNTER — Encounter: Payer: Self-pay | Admitting: Internal Medicine

## 2014-11-02 ENCOUNTER — Telehealth: Payer: Self-pay | Admitting: Internal Medicine

## 2014-11-02 NOTE — Telephone Encounter (Signed)
Patient called this morning and said she been having diarrhea intermittently for about 4 weeks. Recently started traveling internationally. Also ate some vegetables from [place] that have been associated with Listeria infection. She is concerned about food poisoning. Leaving for [place] tonight. She works as a Financial controllerflight attendant. No fever or chills. No bloody diarrhea.  We'll call in Cipro 500 mg twice daily for 3 days along with Diflucan 150 mg tablet with one refill. [facility] [address] [facility] morning. If she is not getting better in the next few days, we need to do stool studies. She will be back until Wednesday night and then is leaving fairly soon for [place].

## 2014-12-24 ENCOUNTER — Telehealth: Payer: Self-pay | Admitting: Internal Medicine

## 2014-12-24 ENCOUNTER — Ambulatory Visit (INDEPENDENT_AMBULATORY_CARE_PROVIDER_SITE_OTHER): Payer: BLUE CROSS/BLUE SHIELD | Admitting: Internal Medicine

## 2014-12-24 ENCOUNTER — Encounter: Payer: Self-pay | Admitting: Internal Medicine

## 2014-12-24 VITALS — BP 110/74 | HR 71 | Temp 98.4°F | Wt 165.0 lb

## 2014-12-24 DIAGNOSIS — J069 Acute upper respiratory infection, unspecified: Secondary | ICD-10-CM

## 2014-12-24 MED ORDER — AZITHROMYCIN 250 MG PO TABS: ORAL_TABLET | ORAL | Status: DC

## 2014-12-24 MED ORDER — METHYLPREDNISOLONE ACETATE 80 MG/ML IJ SUSP
80.0000 mg | Freq: Once | INTRAMUSCULAR | Status: AC
  Administered 2014-12-24: 80 mg via INTRAMUSCULAR

## 2014-12-24 NOTE — Progress Notes (Signed)
   Subjective:    Patient ID: Carrie Mosley, female    DOB: 01-20-1961, 54 y.o.   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 A couple of weeks ago returned from [place] and then drove to [place] to a large group meeting and then drove back from [place] to her home. Has come down with a respiratory infection. Has to fly out to [place] and to [place] again this weekend. Next week she flies to [place]. Has some blood coming out of her nose when she blows her nose. No fever or shaking chills. Some cough. Has been hoarse.    Review of Systems     Objective:   Physical Exam  Pharynx is clear. TMs are clear. Neck is supple without adenopathy. She sounds worse when she speaks. Chest clear to auscultation without rales or wheezing      Assessment & Plan:  Acute URI  Plan: Depo-Medrol 80 mg IM. Zithromax Z-Pak take 2 tablets day one followed by 1 tablet days 2 through 5. She has Diflucan with her should she develop Candida vaginitis while on Zithromax.

## 2014-12-24 NOTE — Patient Instructions (Signed)
Zithromax Z-PAK take as directed. Depo-Medrol 80 mg IM given today for congestion. Diflucan already on hand if develops yeast  infection. Rest and drink plenty of fluids.

## 2014-12-25 NOTE — Telephone Encounter (Signed)
Patient called 7/21 @ 4pm stating to please let Dr. Lenord Fellers know that she did not go on her trip.  States that she was not feeling well.  Wanted Dr. Lenord Fellers to know that she was advised to possibly complete intermittent FMLA paperwork for these sickneses as they seem to be reoccurring.  Wanted to know if Dr. Lenord Fellers thought she should complete FMLA paperwork so her employer wouldn't continue to make her use her sick time when she's out of work for these types of sickness.      Spoke with Dr. Lenord Fellers; she advised that these were "normal sicknesses" and we would not be able to complete intermittent FMLA paperwork for these sicknesses for that reason.  These type of sicknesses just have to run their course.  You have to treat them and get rest.  To fill out paperwork would be fraud.    Called patient back to advise of the message per Dr. Lenord Fellers.  Patient verbalized understanding of the message.

## 2015-01-19 ENCOUNTER — Ambulatory Visit (INDEPENDENT_AMBULATORY_CARE_PROVIDER_SITE_OTHER): Payer: BLUE CROSS/BLUE SHIELD | Admitting: Internal Medicine

## 2015-01-19 ENCOUNTER — Encounter: Payer: Self-pay | Admitting: Internal Medicine

## 2015-01-19 VITALS — BP 126/68 | HR 89 | Temp 98.1°F | Wt 164.0 lb

## 2015-01-19 DIAGNOSIS — M545 Low back pain, unspecified: Secondary | ICD-10-CM

## 2015-01-19 MED ORDER — MELOXICAM 15 MG PO TABS: 15.0000 mg | ORAL_TABLET | Freq: Every day | ORAL | Status: DC

## 2015-01-19 NOTE — Patient Instructions (Addendum)
Take Mobic as instructed. If not better in 2 weeks, consider physical therapy
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
## 2015-06-25 ENCOUNTER — Ambulatory Visit (INDEPENDENT_AMBULATORY_CARE_PROVIDER_SITE_OTHER): Payer: BLUE CROSS/BLUE SHIELD | Admitting: Internal Medicine

## 2015-06-25 ENCOUNTER — Encounter: Payer: Self-pay | Admitting: Internal Medicine

## 2015-06-25 VITALS — BP 110/72 | HR 68 | Temp 97.6°F | Resp 20 | Ht 64.0 in | Wt 147.0 lb

## 2015-06-25 DIAGNOSIS — A09 Infectious gastroenteritis and colitis, unspecified: Secondary | ICD-10-CM | POA: Diagnosis not present

## 2015-06-25 DIAGNOSIS — R197 Diarrhea, unspecified: Secondary | ICD-10-CM

## 2015-06-25 MED ORDER — METRONIDAZOLE 500 MG PO TABS: 500.0000 mg | ORAL_TABLET | Freq: Two times a day (BID) | ORAL | Status: DC

## 2015-06-25 MED ORDER — CIPROFLOXACIN HCL 500 MG PO TABS: ORAL_TABLET | ORAL | Status: DC

## 2015-06-25 MED ORDER — FLUCONAZOLE 150 MG PO TABS: 150.0000 mg | ORAL_TABLET | Freq: Once | ORAL | Status: DC

## 2015-06-25 NOTE — Progress Notes (Addendum)
   Subjective:    Patient ID: Carrie Mosley, female    DOB: 04-26-1961, 55 y.o.   MRN: [id number]  HPI Patient came down with diarrhea after she developed a cold. 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 Initially had several stools daily. No blood in stool. No fever or chills. Has not been traveling overseas. Now continues to have issues with diarrhea. Has some remote history of lactose intolerance. Diarrhea is described basically is brown water without melena or blood.  She has joined [employer] and has lost about 20 pounds.    Review of Systems as above     Objective:   Physical Exam  No abdominal tenderness. No masses.      Assessment & Plan:  Gastroenteritis -likely viral however she does travel a lot and eats out quite a bit. No recent antibiotics.  Plan: Cipro 500 mg twice daily for 3 days. Flagyl 500 mg twice daily for 7 days. Clear liquids until diarrhea resolves. May benefit from probiotics. Diflucan if develops yeast infection while on antibiotics. She is scheduled for physical examination for March.

## 2015-06-25 NOTE — Patient Instructions (Addendum)
Clear liquids until diarrhea resolves. Cipro 500 mg twice daily for 3 days. Flagyl 500 mg twice daily for 7 days. Diflucan if needed for Candida vaginitis. May try probiotics after completing course of antibiotics.

## 2015-07-03 ENCOUNTER — Telehealth: Payer: Self-pay | Admitting: Internal Medicine

## 2015-07-03 DIAGNOSIS — R197 Diarrhea, unspecified: Secondary | ICD-10-CM | POA: Diagnosis not present

## 2015-07-03 NOTE — Telephone Encounter (Signed)
Was seen January 20 complaining of three-week history of diarrhea onset after respiratory infection. Was treated with Cipro and Flagyl. Just finished antibiotic treatment yesterday. Every time she eats she gets diarrhea. Reminded her that she needed to stay with clear liquids until diarrhea had resolved. She did not realize that. She has not tried probiotics because she just finished the antibiotics. Had not previously had antibiotics for number of weeks before developing this diarrhea. Has appointment for physical examination in March. If she does not improve in the next couple of weeks we will consider gastroenterology referral. She may need to have some stool studies done in the meantime.

## 2015-07-20 ENCOUNTER — Other Ambulatory Visit: Payer: Self-pay

## 2015-07-20 DIAGNOSIS — Z1231 Encounter for screening mammogram for malignant neoplasm of breast: Secondary | ICD-10-CM

## 2015-08-09 ENCOUNTER — Ambulatory Visit (INDEPENDENT_AMBULATORY_CARE_PROVIDER_SITE_OTHER): Payer: BLUE CROSS/BLUE SHIELD | Admitting: Internal Medicine

## 2015-08-09 ENCOUNTER — Encounter: Payer: Self-pay | Admitting: Internal Medicine

## 2015-08-09 VITALS — BP 122/70 | HR 68 | Temp 97.7°F | Resp 20 | Ht 64.0 in | Wt 149.5 lb

## 2015-08-09 DIAGNOSIS — Z13220 Encounter for screening for lipoid disorders: Secondary | ICD-10-CM

## 2015-08-09 DIAGNOSIS — Z130 Encounter for screening for diseases of the blood and blood-forming organs and certain disorders involving the immune mechanism: Secondary | ICD-10-CM | POA: Diagnosis not present

## 2015-08-09 DIAGNOSIS — Z1329 Encounter for screening for other suspected endocrine disorder: Secondary | ICD-10-CM

## 2015-08-09 DIAGNOSIS — R109 Unspecified abdominal pain: Secondary | ICD-10-CM

## 2015-08-09 DIAGNOSIS — Z0000 Encounter for general adult medical examination without abnormal findings: Secondary | ICD-10-CM | POA: Diagnosis not present

## 2015-08-09 DIAGNOSIS — E559 Vitamin D deficiency, unspecified: Secondary | ICD-10-CM | POA: Diagnosis not present

## 2015-08-09 DIAGNOSIS — I8393 Asymptomatic varicose veins of bilateral lower extremities: Secondary | ICD-10-CM

## 2015-08-09 LAB — LIPID PANEL
CHOLESTEROL: 235 mg/dL — AB (ref 125–200)
HDL: 78 mg/dL (ref >=46)
LDL Cholesterol: 136 mg/dL — ABNORMAL HIGH (ref <130)
Total CHOL/HDL Ratio: 3 Ratio (ref <=5.0)
Triglycerides: 106 mg/dL (ref <150)
VLDL: 21 mg/dL (ref <30)

## 2015-08-09 LAB — CBC WITH DIFFERENTIAL/PLATELET
BASOS PCT: 1 % (ref 0–1)
Basophils Absolute: 0 10*3/uL (ref 0.0–0.1)
EOS ABS: 0.1 10*3/uL (ref 0.0–0.7)
Eosinophils Relative: 4 % (ref 0–5)
HCT: 44.2 % (ref 36.0–46.0)
HEMOGLOBIN: 15.1 g/dL — AB (ref 12.0–15.0)
Lymphocytes Relative: 36 % (ref 12–46)
Lymphs Abs: 1.2 10*3/uL (ref 0.7–4.0)
MCH: 28 pg (ref 26.0–34.0)
MCHC: 34.2 g/dL (ref 30.0–36.0)
MCV: 81.9 fL (ref 78.0–100.0)
MONO ABS: 0.2 10*3/uL (ref 0.1–1.0)
MONOS PCT: 6 % (ref 3–12)
MPV: 10.9 fL (ref 8.6–12.4)
NEUTROS ABS: 1.7 10*3/uL (ref 1.7–7.7)
NEUTROS PCT: 53 % (ref 43–77)
PLATELETS: 184 10*3/uL (ref 150–400)
RBC: 5.4 MIL/uL — AB (ref 3.87–5.11)
RDW: 14 % (ref 11.5–15.5)
WBC: 3.3 10*3/uL — AB (ref 4.0–10.5)

## 2015-08-09 LAB — COMPLETE METABOLIC PANEL WITH GFR
ALT: 9 U/L (ref 6–29)
AST: 14 U/L (ref 10–35)
Albumin: 4.4 g/dL (ref 3.6–5.1)
Alkaline Phosphatase: 114 U/L (ref 33–130)
BUN: 13 mg/dL (ref 7–25)
CO2: 27 mmol/L (ref 20–31)
Calcium: 9.6 mg/dL (ref 8.6–10.4)
Chloride: 100 mmol/L (ref 98–110)
Creat: 0.8 mg/dL (ref 0.50–1.05)
GFR, EST NON AFRICAN AMERICAN: 84 mL/min (ref >=60)
GFR, Est African American: >89 mL/min (ref >=60)
GLUCOSE: 83 mg/dL (ref 65–99)
POTASSIUM: 4.5 mmol/L (ref 3.5–5.3)
SODIUM: 142 mmol/L (ref 135–146)
Total Bilirubin: 0.6 mg/dL (ref 0.2–1.2)
Total Protein: 8 g/dL (ref 6.1–8.1)

## 2015-08-09 LAB — TSH: TSH: 1.92 mIU/L

## 2015-08-09 LAB — POCT URINALYSIS DIPSTICK
BILIRUBIN UA: NEGATIVE
Blood, UA: NEGATIVE
Glucose, UA: NEGATIVE
KETONES UA: NEGATIVE
LEUKOCYTES UA: NEGATIVE
Nitrite, UA: NEGATIVE
PH UA: 7
PROTEIN UA: NEGATIVE
SPEC GRAV UA: 1.015
Urobilinogen, UA: 0.2

## 2015-08-09 NOTE — Progress Notes (Signed)
Subjective:    Patient ID: Carrie Mosley, female    DOB: Oct 05, 1960, 55 y.o.   MRN: [id number]  HPI Was treated late January for gastroenteritis. She came down with a cold and subsequently developed diarrhea. 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 Initially had several stools daily. No blood in stool. No fever or chills. Has not been traveling overseas. Was treated with Cipro for 3 days and Flagyl for 7 days. Continues to have issues with GI tract. Has abdominal cramping in am. No diarrhea and stool  is formed. Is on Weight Watchers diet. Has lost weight and is eating more healthy. In January weighed 147 pounds. In April 2016 she weighed 165 pounds.  Records from November 2015 indicates she had nausea and cramping after eating dairy products. Was suspected she might have lactose intolerance. Was treated with Cipro for 3 days at that time but cause a possible foodborne pathogen.  She has a history of vitamin D deficiency. Became menopausal in 2011 is had hot flashes. Dr. cousins is GYN physician.  History of recurrent bacterial vaginosis.  History of occasional migraine headaches.  History of a benign cardiac murmur.  Kidney stone 1989.  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 This seems to be improved over the past years. She saw Dr. Lenoria Farrier at [facility] April 2012 and he thought she had eustachian tube dysfunction. She was allergy tested in 2003 and there was no hypersensitivity to a screening panel of allergens. At that time a limited CT of the sinuses was obtained showing no sinus disease.  Social history: Single, never married. No children. Rarely drinks alcohol and does not smoke. She has a black belt in karate. 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  Family history: Brother died with complications of of diabetes. No sisters. Mother died of apparent septicemia. Father  died of heart failure in his 42s. He had diabetes and coronary artery disease.    Review of Systems  Flatus and abdominal cramping. Complains of varicose veins which are long-standing.     Objective:   Physical Exam  Constitutional: She is oriented to person, place, and time. She appears well-developed and well-nourished. No distress.  HENT:  Head: Normocephalic and atraumatic.  Right Ear: External ear normal.  Left Ear: External ear normal.  Mouth/Throat: Oropharynx is clear and moist.  Eyes: Conjunctivae and EOM are normal. Pupils are equal, round, and reactive to light. Right eye exhibits no discharge. Left eye exhibits no discharge. No scleral icterus.  Neck: Neck supple. No JVD present. No thyromegaly present.  Cardiovascular: Normal rate, regular rhythm and normal heart sounds.   1/6 systolic ejection murmur  Pulmonary/Chest: Effort normal and breath sounds normal. She has no wheezes. She has no rales.  Breasts normal female without masses  Abdominal: Soft. Bowel sounds are normal. She exhibits no distension and no mass. There is no tenderness. There is no rebound and no guarding.  Genitourinary:  Deferred to Dr. Cherly Hensen  Musculoskeletal: She exhibits no edema.  Neurological: She is alert and oriented to person, place, and time. No cranial nerve deficit. Coordination normal.  Skin: Skin is warm and dry. No rash noted. She is not diaphoretic.  Psychiatric: She has a normal mood and affect. Her behavior is normal. Judgment normal.  Vitals reviewed.         Assessment & Plan:  Abdominal cramping-possible lactose  intolerance versus irritable bowel syndrome. Has been treated for foodborne pathogen in January 2017. If symptoms do not improve, may need GI consultation for further evaluation. May need to rule out colitis but she's currently not having diarrhea. [facility] in 2015 which was normal with 10 year follow-up  recommended  Varicose veins-can refer to vascular surgeon regarding treatment. These are not painful just unsightly to the patient  Routine health maintenance-get annual mammogram  Plan: Return in one year or as needed.Continue diet and exercise. Total cholesterol seems a bit high at 235 but she has a high HDL cholesterol and LDL cholesterol is actually fairly good at 136.

## 2015-08-10 LAB — VITAMIN D 25 HYDROXY (VIT D DEFICIENCY, FRACTURES): Vit D, 25-Hydroxy: 33 ng/mL (ref 30–100)

## 2015-08-11 ENCOUNTER — Ambulatory Visit
Admission: RE | Admit: 2015-08-11 | Discharge: 2015-08-11 | Disposition: A | Discharge status: Home / Self Care | Payer: BLUE CROSS/BLUE SHIELD | Source: Ambulatory Visit

## 2015-08-11 DIAGNOSIS — Z1231 Encounter for screening mammogram for malignant neoplasm of breast: Secondary | ICD-10-CM

## 2015-09-01 ENCOUNTER — Encounter: Payer: Self-pay | Admitting: Internal Medicine

## 2015-09-01 NOTE — Patient Instructions (Signed)
If GI symptoms do not resolve, we will refer [facility] for [facility] where he had colonoscopy. Referral to be made to vascular surgeon regarding treatment of varicose veins

## 2015-10-01 ENCOUNTER — Encounter: Payer: Self-pay | Admitting: Vascular Surgery

## 2015-10-06 ENCOUNTER — Other Ambulatory Visit: Payer: Self-pay | Admitting: *Deleted

## 2015-10-06 DIAGNOSIS — I83893 Varicose veins of bilateral lower extremities with other complications: Secondary | ICD-10-CM

## 2015-10-08 ENCOUNTER — Ambulatory Visit (INDEPENDENT_AMBULATORY_CARE_PROVIDER_SITE_OTHER): Payer: BLUE CROSS/BLUE SHIELD | Admitting: Vascular Surgery

## 2015-10-08 ENCOUNTER — Ambulatory Visit (HOSPITAL_COMMUNITY)
Admission: RE | Admit: 2015-10-08 | Discharge: 2015-10-08 | Disposition: A | Discharge status: Home / Self Care | Payer: BLUE CROSS/BLUE SHIELD | Source: Ambulatory Visit | Attending: Vascular Surgery | Admitting: Vascular Surgery

## 2015-10-08 ENCOUNTER — Encounter: Payer: Self-pay | Admitting: Vascular Surgery

## 2015-10-08 VITALS — BP 117/61 | HR 65 | Temp 97.9°F | Resp 14 | Ht 65.0 in | Wt 145.0 lb

## 2015-10-08 DIAGNOSIS — I83893 Varicose veins of bilateral lower extremities with other complications: Secondary | ICD-10-CM

## 2015-10-08 DIAGNOSIS — I8391 Asymptomatic varicose veins of right lower extremity: Secondary | ICD-10-CM | POA: Diagnosis not present

## 2015-10-08 NOTE — Progress Notes (Signed)
Referred by:  [name], MD [address] [place], [place] [id number]  Reason for referral: bilateral varicose veins   History of Present Illness  Carrie SchwabFelicia Mosley is a 55 y.o. (1960/11/19) female who presents with chief complaint: aching in both legs.  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  She notes some aching her legs associated with which improves with sitting.  The patient has had no history of DVT, no history of pregnancy, known history of varicose vein, no history of venous stasis ulcers, no history of  Lymphedema and no history of skin changes in lower legs.  There is known family history of venous disorders.  The patient has never used compression stockings in the past.   Past Medical History  Diagnosis Date  . Migraine   . Menopause   . Vitamin D deficiency   . Varicose veins     Past Surgical History: none  Social History   Social History  . Marital Status: Single    Spouse Name: N/A  . Number of Children: 0  . Years of Education: N/A   Occupational History  . flight attendant    Social History Main Topics  . Smoking status: Never Smoker   . Smokeless tobacco: Never Used  . Alcohol Use: 0.0 oz/week    0 Standard drinks or equivalent per week  . Drug Use: No  . Sexual Activity: Not on file   Other Topics Concern  . Not on file   Social History Narrative    Family History  Problem Relation Age of Onset  . Heart disease Father   . Diabetes Father   . Diabetes Brother     Current Outpatient Prescriptions  Medication Sig Dispense Refill  . cholecalciferol (VITAMIN D) 1000 UNITS tablet Take 1,000 Units by mouth daily.    . Multiple Vitamin (MULTIVITAMIN) capsule Take 1 capsule by mouth daily.    . Probiotic Product ([employer]S ADV PROBIOTIC GUMMIES) CHEW Chew by mouth.     No current facility-administered medications for this visit.    No Known Allergies   REVIEW OF SYSTEMS:   (Positives checked otherwise negative)  CARDIOVASCULAR:   [ ]  chest pain,  [ ]  chest pressure,  [ ]  palpitations,  [ ]  shortness of breath when laying flat,  [ ]  shortness of breath with exertion,   [ ]  pain in feet when walking,  [ ]  pain in feet when laying flat, [ ]  history of blood clot in veins (DVT),  [ ]  history of phlebitis,  [x]  swelling in legs,  [x]  varicose veins  PULMONARY:   [ ]  productive cough,  [ ]  asthma,  [ ]  wheezing  NEUROLOGIC:   [ ]  weakness in arms or legs,  [ ]  numbness in arms or legs,  [ ]  difficulty speaking or slurred speech,  [ ]  temporary loss of vision in one eye,  [ ]  dizziness  HEMATOLOGIC:   [ ]  bleeding problems,  [ ]  problems with blood clotting too easily  MUSCULOSKEL:   [ ]  joint pain, [ ]  joint swelling  GASTROINTEST:   [ ]  vomiting blood,  [ ]  blood in stool     GENITOURINARY:   [ ]  burning with urination,  [ ]  blood in urine  PSYCHIATRIC:   [ ]  history of major depression  INTEGUMENTARY:   [ ]  rashes,  [ ]  ulcers  CONSTITUTIONAL:   [ ]  fever,  [ ]   chills   Physical Examination  Filed Vitals:   10/08/15 1420  BP: 117/61  Pulse: 65  Temp: 97.9 F (36.6 C)  Resp: 14  Height:  (1.651 m)  Weight: 145 lb (65.772 kg)  SpO2: 100%   Body mass index is 24.13 kg/(m^2).  General: A&O x 3, WD, thin  Head: [place]/AT  Ear/Nose/Throat: Hearing grossly intact, nares without erythema or drainage, oropharynx without Erythema/Exudate, Mallampati score: 3  Eyes: PERRLA, EOMI  Neck: Supple, no nuchal rigidity, no palpable LAD  Pulmonary: Sym exp, good air movt, CTAB, no rales, rhonchi, & wheezing  Cardiac: RRR, Nl S1, S2, no Murmurs, rubs or gallops  Vascular: Vessel Right Left  Radial Palpable Palpable  Brachial Palpable Palpable  Carotid Palpable, without bruit Palpable, without bruit  Aorta Not palpable N/A  Femoral Palpable Palpable  Popliteal Not palpable Not palpable  PT Palpable Palpable  DP  Palpable Palpable   Gastrointestinal: soft, NTND, no G/R, no HSM, no masses, no CVAT B  Musculoskeletal: M/S 5/5 throughout , Extremities without ischemic changes , no LDS, no edema, small varicosities, extensive spider veins  Neurologic: CN 2-12 intact , Pain and light touch intact in extremities , Motor exam as listed above  Psychiatric: Judgment intact, Mood & affect appropriate for pt's clinical situation  Dermatologic: See M/S exam for extremity exam, no rashes otherwise noted  Lymph : No Cervical, Axillary, or Inguinal lymphadenopathy    Non-Invasive Vascular Imaging  BLE Venous Insufficiency Duplex (Date: 10/08/2015):   RLE:   no DVT and SVT,   no GSV reflux,   no SSV reflux,  + deep venous reflux: CFV  LLE:  no DVT and SVT,   no GSV reflux,   no SSV reflux,  no deep venous reflux   Medical Decision Making  Ivory Maduro is a 55 y.o. female who presents with: BLE chronic venous insufficiency (C2), B varicose veins with complications   Pt sx are consistent venous etiology.  Surprisingly her BLE venous reflux exam demonstrated relatively limited disease.  Based on the patient's history and examination, I recommend: compression stockings..  I discussed with the patient the use of her 20-30 mm thigh high compression stockings.  She would like to be consider for sclerotherapy for her spider veins.   She is aware these are consider cosmetic by her carrier.  I have given her our Vein Nurse's contact information for pricing information.  She will follow up with [place] as needed.  Thank you for allowing [place] to participate in this patient's care.   Leonides Sake, MD [facility] Office: [phone] Pager: [phone]  10/08/2015, 2:47 PM

## 2015-10-19 ENCOUNTER — Ambulatory Visit: Payer: BLUE CROSS/BLUE SHIELD | Admitting: *Deleted

## 2015-10-19 DIAGNOSIS — I8393 Asymptomatic varicose veins of bilateral lower extremities: Secondary | ICD-10-CM

## 2015-10-19 NOTE — Progress Notes (Signed)
Assessed the patient's spider veins. I think it will take 2 syringes. 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 She will check her schedule and call me back to make an appt.

## 2016-02-10 ENCOUNTER — Encounter: Payer: BLUE CROSS/BLUE SHIELD | Admitting: Internal Medicine

## 2016-03-06 ENCOUNTER — Other Ambulatory Visit: Payer: Self-pay | Admitting: Internal Medicine

## 2016-03-06 ENCOUNTER — Other Ambulatory Visit (INDEPENDENT_AMBULATORY_CARE_PROVIDER_SITE_OTHER): Payer: BLUE CROSS/BLUE SHIELD | Admitting: Internal Medicine

## 2016-03-06 DIAGNOSIS — Z13220 Encounter for screening for lipoid disorders: Secondary | ICD-10-CM | POA: Diagnosis not present

## 2016-03-06 LAB — LIPID PANEL
Cholesterol: 185 mg/dL (ref 125–200)
HDL: 70 mg/dL
LDL Cholesterol: 100 mg/dL
Total CHOL/HDL Ratio: 2.6 ratio
Triglycerides: 74 mg/dL
VLDL: 15 mg/dL

## 2016-03-09 NOTE — Progress Notes (Signed)
This encounter was created in error - please disregard.

## 2016-03-10 ENCOUNTER — Encounter: Payer: Self-pay | Admitting: *Deleted

## 2016-03-15 ENCOUNTER — Ambulatory Visit (INDEPENDENT_AMBULATORY_CARE_PROVIDER_SITE_OTHER): Payer: BLUE CROSS/BLUE SHIELD | Admitting: *Deleted

## 2016-03-15 DIAGNOSIS — I8393 Asymptomatic varicose veins of bilateral lower extremities: Secondary | ICD-10-CM

## 2016-03-15 NOTE — Progress Notes (Signed)
X=.3% Sotradecol administered with a 27g butterfly.  Patient received a total of 18cc. Treated the majority of her vessels. Easy acces. Tol well. Anticipate good results. She experienced a headache afterward. Gave her two Tylenol and had her stay until she felt better. Follow prn.    Compression stockings applied: Yes.

## 2016-03-16 ENCOUNTER — Encounter: Payer: Self-pay | Admitting: *Deleted

## 2016-08-10 ENCOUNTER — Ambulatory Visit (INDEPENDENT_AMBULATORY_CARE_PROVIDER_SITE_OTHER): Payer: BLUE CROSS/BLUE SHIELD | Admitting: Internal Medicine

## 2016-08-10 ENCOUNTER — Encounter: Payer: Self-pay | Admitting: Internal Medicine

## 2016-08-10 VITALS — BP 110/80 | HR 71 | Temp 98.0°F | Ht 64.0 in | Wt 163.0 lb

## 2016-08-10 DIAGNOSIS — Z13220 Encounter for screening for lipoid disorders: Secondary | ICD-10-CM

## 2016-08-10 DIAGNOSIS — Z1329 Encounter for screening for other suspected endocrine disorder: Secondary | ICD-10-CM

## 2016-08-10 DIAGNOSIS — Z0000 Encounter for general adult medical examination without abnormal findings: Secondary | ICD-10-CM | POA: Diagnosis not present

## 2016-08-10 DIAGNOSIS — Z8639 Personal history of other endocrine, nutritional and metabolic disease: Secondary | ICD-10-CM

## 2016-08-10 DIAGNOSIS — I8393 Asymptomatic varicose veins of bilateral lower extremities: Secondary | ICD-10-CM

## 2016-08-10 DIAGNOSIS — E739 Lactose intolerance, unspecified: Secondary | ICD-10-CM | POA: Diagnosis not present

## 2016-08-10 DIAGNOSIS — K580 Irritable bowel syndrome with diarrhea: Secondary | ICD-10-CM | POA: Diagnosis not present

## 2016-08-10 DIAGNOSIS — N951 Menopausal and female climacteric states: Secondary | ICD-10-CM | POA: Diagnosis not present

## 2016-08-10 DIAGNOSIS — E782 Mixed hyperlipidemia: Secondary | ICD-10-CM

## 2016-08-10 LAB — COMPREHENSIVE METABOLIC PANEL
ALBUMIN: 4.6 g/dL (ref 3.6–5.1)
ALT: 14 U/L (ref 6–29)
AST: 21 U/L (ref 10–35)
Alkaline Phosphatase: 129 U/L (ref 33–130)
BILIRUBIN TOTAL: 0.6 mg/dL (ref 0.2–1.2)
BUN: 10 mg/dL (ref 7–25)
CO2: 23 mmol/L (ref 20–31)
CREATININE: 0.91 mg/dL (ref 0.50–1.05)
Calcium: 9.8 mg/dL (ref 8.6–10.4)
Chloride: 100 mmol/L (ref 98–110)
Glucose, Bld: 81 mg/dL (ref 65–99)
Potassium: 4 mmol/L (ref 3.5–5.3)
Sodium: 138 mmol/L (ref 135–146)
Total Protein: 8.3 g/dL — ABNORMAL HIGH (ref 6.1–8.1)

## 2016-08-10 LAB — CBC WITH DIFFERENTIAL/PLATELET
BASOS PCT: 0 %
Basophils Absolute: 0 cells/uL (ref 0–200)
EOS PCT: 3 %
Eosinophils Absolute: 96 cells/uL (ref 15–500)
HCT: 45.3 % — ABNORMAL HIGH (ref 35.0–45.0)
HEMOGLOBIN: 15 g/dL (ref 11.7–15.5)
LYMPHS ABS: 992 {cells}/uL (ref 850–3900)
Lymphocytes Relative: 31 %
MCH: 27 pg (ref 27.0–33.0)
MCHC: 33.1 g/dL (ref 32.0–36.0)
MCV: 81.6 fL (ref 80.0–100.0)
MPV: 10.9 fL (ref 7.5–12.5)
Monocytes Absolute: 192 cells/uL — ABNORMAL LOW (ref 200–950)
Monocytes Relative: 6 %
NEUTROS PCT: 60 %
Neutro Abs: 1920 cells/uL (ref 1500–7800)
Platelets: 206 10*3/uL (ref 140–400)
RBC: 5.55 MIL/uL — AB (ref 3.80–5.10)
RDW: 14.1 % (ref 11.0–15.0)
WBC: 3.2 10*3/uL — AB (ref 3.8–10.8)

## 2016-08-10 LAB — LIPID PANEL
Cholesterol: 240 mg/dL — ABNORMAL HIGH (ref <200)
HDL: 72 mg/dL (ref >50)
LDL CALC: 131 mg/dL — AB (ref <100)
TRIGLYCERIDES: 184 mg/dL — AB (ref <150)
Total CHOL/HDL Ratio: 3.3 Ratio (ref <5.0)
VLDL: 37 mg/dL — ABNORMAL HIGH (ref <30)

## 2016-08-10 LAB — POCT URINALYSIS DIPSTICK
Bilirubin, UA: NEGATIVE
Glucose, UA: NEGATIVE
Ketones, UA: NEGATIVE
Leukocytes, UA: NEGATIVE
Nitrite, UA: NEGATIVE
PROTEIN UA: NEGATIVE
SPEC GRAV UA: 1.015
UROBILINOGEN UA: NEGATIVE
pH, UA: 6

## 2016-08-10 LAB — TSH: TSH: 1.95 m[IU]/L

## 2016-08-10 MED ORDER — CIPROFLOXACIN HCL 500 MG PO TABS: 500.0000 mg | ORAL_TABLET | Freq: Two times a day (BID) | ORAL | 0 refills | Status: DC

## 2016-08-10 MED ORDER — DICYCLOMINE HCL 20 MG PO TABS: 20.0000 mg | ORAL_TABLET | Freq: Three times a day (TID) | ORAL | 1 refills | Status: DC

## 2016-08-10 MED ORDER — FLUCONAZOLE 150 MG PO TABS: 150.0000 mg | ORAL_TABLET | Freq: Once | ORAL | 1 refills | Status: AC

## 2016-08-10 MED ORDER — METRONIDAZOLE 500 MG PO TABS: 500.0000 mg | ORAL_TABLET | Freq: Two times a day (BID) | ORAL | 0 refills | Status: DC

## 2016-08-10 NOTE — Progress Notes (Signed)
Subjective:    Patient ID: Carrie Mosley, female    DOB: 30-Apr-1961, 56 y.o.   MRN: [id number]  HPI  56 year old Black Female in today for health maintenance exam and evaluation of medical issues. She stopped doing Weight Watchers and gained weight. She's been having issues with diarrhea. Seems to be triggered by certain foods. Finds it hard to eat out at restaurants without having an attack of diarrhea.  Discussed possibility of lactose intolerance. Likely has irritable bowel syndrome. Records from 2015 indicate she had nausea and abdominal cramping after eating dairy products. She was treated with Cipro for 3 days for possible foodborne infection but was suspected of having lactose intolerance at that time.  She has a history of vitamin D deficiency. She became menopausal in 2011 and has had hot flashes. Dr. Cherly Hensen is her GYN physician.  History of recurrent bacterial vaginosis but not recently.  History of occasional migraine headaches. History of benign cardiac murmur.  Kidney stone 1989.  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 This is improved over the past few years. She saw Dr. pills. [facility] April 2012 and he thought she had eustachian tube dysfunction. She was allergy tested in 2003 and there was no hypersensitivity to a screening panel of allergens. At that time, limited CT of the sinuses was obtained showing no sinus disease.  Social history: Single, never married. No children. Rarely drinks alcohol and does not smoke. She has a black belt in karate. 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 Does work within the [place] and also overseas at times.  Family history: Brother died with complications of diabetes. No sisters. Mother died of apparent septicemia. Father died of heart failure in his 29s. He had diabetes and coronary artery  disease.      Review of Systems complains of excessive flatus. This may be related to lactose intolerance/irritable bowel syndrome. Superficial varicosities which are long-standing.     Objective:   Physical Exam  Constitutional: She is oriented to person, place, and time. She appears well-developed and well-nourished. No distress.  HENT:  Head: Normocephalic and atraumatic.  Right Ear: External ear normal.  Left Ear: External ear normal.  Mouth/Throat: Oropharynx is clear and moist.  Eyes: Conjunctivae and EOM are normal. Pupils are equal, round, and reactive to light. Left eye exhibits no discharge.  Neck: Neck supple. No JVD present. No thyromegaly present.  Cardiovascular: Normal rate, regular rhythm and normal heart sounds.   No murmur heard. Pulmonary/Chest: Effort normal and breath sounds normal. No respiratory distress. She has no rales.  Breasts normal female without masses  Abdominal: Soft. Bowel sounds are normal. She exhibits no distension and no mass. There is no tenderness. There is no rebound and no guarding.  Genitourinary:  Genitourinary Comments: Deferred to GYN  Musculoskeletal: She exhibits no edema.  Lymphadenopathy:    She has no cervical adenopathy.  Neurological: She is alert and oriented to person, place, and time. She has normal reflexes. No cranial nerve deficit. Coordination normal.  Skin: Skin is warm and dry. No rash noted. She is not diaphoretic.  Multiple superficial varicosities lower extremities  Psychiatric: She has a normal mood and affect. Her behavior is normal. Judgment and thought content normal.  Vitals reviewed.         Assessment & Plan:  Probable lactose intolerance  Irritable bowel syndrome-trial of  Bentyl one half hour before meals.  Excessive flatus-may benefit from FD guard  Superficial varicosities legs-has sought treatment previously.  History of vitamin D deficiency  Hyperlipidemia-she is come off Weight Watchers  diet and cholesterol has risen from 185-240. Triglycerides are elevated from 74-184. She has an HDL cholesterol of 72. LDL cholesterol has increased from 100-131.  Hot flashes-should discuss with Dr. Cherly Hensenousins. She has not wanted to be on estrogen replacement. May try Bentyl 20 mg one half hour before meals and at bedtime.  She has been to [place] recently. I have treated her with a three-day course of Cipro and a 5 day course of Flagyl. She is wondering if she has contracted foodborne illness from that trip.  The symptoms persist, she may need to see gastroenterologist to rule out colitis.  Plan: Suggest she have repeat lipid panel in 6 months.

## 2016-08-11 ENCOUNTER — Other Ambulatory Visit: Payer: Self-pay | Admitting: Internal Medicine

## 2016-08-11 DIAGNOSIS — Z1231 Encounter for screening mammogram for malignant neoplasm of breast: Secondary | ICD-10-CM

## 2016-08-11 LAB — VITAMIN D 25 HYDROXY (VIT D DEFICIENCY, FRACTURES): Vit D, 25-Hydroxy: 36 ng/mL (ref 30–100)

## 2016-08-27 NOTE — Patient Instructions (Signed)
Visit pleasure to see you today. Take Cipro and Flagyl as directed. Try been 20 one half hour before meals for irritable bowel symptoms. May want to try FD guard for excessive flatus. Return in 6 months to have cholesterol rechecked.

## 2016-09-04 ENCOUNTER — Ambulatory Visit: Payer: BLUE CROSS/BLUE SHIELD

## 2016-09-28 ENCOUNTER — Ambulatory Visit
Admission: RE | Admit: 2016-09-28 | Discharge: 2016-09-28 | Disposition: A | Discharge status: Home / Self Care | Payer: BLUE CROSS/BLUE SHIELD | Source: Ambulatory Visit | Attending: Internal Medicine | Admitting: Internal Medicine

## 2016-09-28 DIAGNOSIS — Z1231 Encounter for screening mammogram for malignant neoplasm of breast: Secondary | ICD-10-CM

## 2016-10-19 ENCOUNTER — Ambulatory Visit (INDEPENDENT_AMBULATORY_CARE_PROVIDER_SITE_OTHER): Payer: BLUE CROSS/BLUE SHIELD | Admitting: Internal Medicine

## 2016-10-19 VITALS — BP 118/70 | HR 70 | Temp 97.5°F | Ht 64.0 in | Wt 165.0 lb

## 2016-10-19 DIAGNOSIS — J069 Acute upper respiratory infection, unspecified: Secondary | ICD-10-CM | POA: Diagnosis not present

## 2016-10-19 DIAGNOSIS — J329 Chronic sinusitis, unspecified: Secondary | ICD-10-CM | POA: Diagnosis not present

## 2016-10-19 MED ORDER — CEFTRIAXONE SODIUM 1 G IJ SOLR
1.0000 g | Freq: Once | INTRAMUSCULAR | Status: AC
  Administered 2016-10-19: 1 g via INTRAMUSCULAR

## 2016-10-24 ENCOUNTER — Telehealth: Payer: Self-pay | Admitting: Internal Medicine

## 2016-10-24 NOTE — Telephone Encounter (Signed)
Please advise on below  

## 2016-10-24 NOTE — Telephone Encounter (Signed)
Patient is calling in stating that she is feeling much better than she was but now she is having diarrhea.  She stated she has tried OTC meds and even the pill that was given to her to take before she eats but she is still having diarrhea. She states that she is going out of town to [place] tomorrow, 10/25/2016, and doesn't want to be battling diarrhea on the plane.

## 2016-10-24 NOTE — Telephone Encounter (Signed)
Patient  has not completed course of Zithromax for respiratory infection. She has developed diarrhea which she says is multiple stools daily. She had similar issues with antibiotics January 2017. Advise clear liquids. She has tried Imodium. She may want to start on probiotics. If she's not getting better next week we will do stool studies to check for C. difficile.

## 2016-10-30 ENCOUNTER — Encounter: Payer: Self-pay | Admitting: Internal Medicine

## 2016-10-30 NOTE — Progress Notes (Signed)
   Subjective:    Patient ID: Carrie SchwabFelicia Mosley, female    DOB: 1960/07/29, 56 y.o.   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 She was on one of these trips recently exposed to a lot of coughing passengers. She did not feel well when she left the flight. Subsequently came down with a respiratory infection with cough and congestion. Has had malaise and fatigue.    Review of Systems     Objective:   Physical Exam Skin warm and dry. Nodes none. TMs are clear. Pharynx is clear. Neck is supple without adenopathy. Chest clear to auscultation. She sounds a bit nasally congested.       Assessment & Plan:  Acute URI  Acute sinusitis  Plan: 1 g IM Rocephin given in office. Augmentin 875 mg twice daily for 10 days. Tessalon Perles 200 mg 3 times daily as needed for cough. Rest and drink plenty of fluids. Refill promethazine dextromethorphan cough syrup.

## 2016-10-30 NOTE — Patient Instructions (Addendum)
Rocephin 1 g IM given in office. Augmentin 875 mg twice daily for 10 days. Tessalon Perles as needed for cough and promethazine dextromethorphan cough syrup refilled.

## 2016-11-06 ENCOUNTER — Encounter: Payer: Self-pay | Admitting: *Deleted

## 2016-11-08 ENCOUNTER — Ambulatory Visit: Payer: BLUE CROSS/BLUE SHIELD | Admitting: *Deleted

## 2016-11-08 DIAGNOSIS — I8393 Asymptomatic varicose veins of bilateral lower extremities: Secondary | ICD-10-CM

## 2016-11-08 NOTE — Progress Notes (Signed)
Pt came in to have me look at her sclerotherapy results from last Oct. She has had closure but also has been left with some staining. There are very small vessels in some of the treated areas. Ordinarily, I would say she needs another treatment; but since she stains, we need to see if that resolves first. (She does not have realistic expectations even though she read the consent form and took home the after care instructions. I reminded her that often additional treatments are needed.) Will give this more time and if the staining resolves, she probably just needs one syringe more to clean up the remaining areas. Follow prn.

## 2016-12-18 ENCOUNTER — Ambulatory Visit (INDEPENDENT_AMBULATORY_CARE_PROVIDER_SITE_OTHER): Payer: BLUE CROSS/BLUE SHIELD | Admitting: Internal Medicine

## 2016-12-18 ENCOUNTER — Encounter: Payer: Self-pay | Admitting: Internal Medicine

## 2016-12-18 VITALS — BP 112/70 | HR 68 | Temp 97.4°F | Wt 165.0 lb

## 2016-12-18 DIAGNOSIS — K5900 Constipation, unspecified: Secondary | ICD-10-CM | POA: Diagnosis not present

## 2016-12-18 DIAGNOSIS — K625 Hemorrhage of anus and rectum: Secondary | ICD-10-CM

## 2016-12-18 DIAGNOSIS — E739 Lactose intolerance, unspecified: Secondary | ICD-10-CM | POA: Diagnosis not present

## 2016-12-18 NOTE — Progress Notes (Signed)
   Subjective:    Patient ID: Carrie Mosley, female    DOB: 09/29/1960, 56 y.o.   MRN: [id number]  HPI  Onset  rectal bleeding once after hard stool and once after drinking milkshake. She developed diarrhea after drinking milkshake. Do think she has lactose intolerance. She's had these symptoms before after drinking milk products.  She had colonoscopy in 2015 that was entirely normal in 10 year follow-up recommended. There was no mention of hemorrhoids.    Review of Systems     Objective:   Physical Exam  Digital rectal exam is normal. No external hemorrhoid tags are noted. No anal fissures noted. No active bleeding noted. Anoscopy shows no evidence of significant internal hemorrhoids and no masses. Stool was trace guaiac positive but she had recently eaten red meat. No bright red  blood noted in stool      Assessment & Plan:  It is my impression that rectal bleeding was brought on by hard stool. I do think she has lactose intolerance. She was reassured. She is to watch her diet and try to avoid constipation when traveling. 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 She may benefit from daily MiraLAX.

## 2016-12-27 NOTE — Patient Instructions (Signed)
May need daily MiraLAX or stool softener to avoid constipation. Watch lactose products as they seem to cause diarrhea. If rectal bleeding persist, we will refer you to gastroenterologist.

## 2017-02-12 ENCOUNTER — Other Ambulatory Visit: Payer: BLUE CROSS/BLUE SHIELD | Admitting: Internal Medicine

## 2017-02-12 DIAGNOSIS — R899 Unspecified abnormal finding in specimens from other organs, systems and tissues: Secondary | ICD-10-CM

## 2017-02-12 LAB — LIPID PANEL
CHOL/HDL RATIO: 2.8 (calc) (ref <5.0)
Cholesterol: 213 mg/dL — ABNORMAL HIGH (ref <200)
HDL: 75 mg/dL (ref >50)
LDL CHOLESTEROL (CALC): 118 mg/dL — AB
NON-HDL CHOLESTEROL (CALC): 138 mg/dL — AB (ref <130)
TRIGLYCERIDES: 101 mg/dL (ref <150)

## 2017-06-12 ENCOUNTER — Ambulatory Visit: Payer: BLUE CROSS/BLUE SHIELD | Admitting: Internal Medicine

## 2017-06-12 ENCOUNTER — Ambulatory Visit
Admission: RE | Admit: 2017-06-12 | Discharge: 2017-06-12 | Disposition: A | Discharge status: Home / Self Care | Payer: BLUE CROSS/BLUE SHIELD | Source: Ambulatory Visit | Attending: Internal Medicine | Admitting: Internal Medicine

## 2017-06-12 ENCOUNTER — Encounter: Payer: Self-pay | Admitting: Internal Medicine

## 2017-06-12 VITALS — BP 116/70 | HR 76 | Temp 98.1°F | Wt 166.0 lb

## 2017-06-12 DIAGNOSIS — R109 Unspecified abdominal pain: Secondary | ICD-10-CM

## 2017-06-12 DIAGNOSIS — K5900 Constipation, unspecified: Secondary | ICD-10-CM

## 2017-06-12 LAB — POC URINALSYSI DIPSTICK (AUTOMATED)
Bilirubin, UA: NEGATIVE
GLUCOSE UA: NEGATIVE
Ketones, UA: NEGATIVE
Leukocytes, UA: NEGATIVE
NITRITE UA: NEGATIVE
Protein, UA: NEGATIVE
Spec Grav, UA: 1.015 (ref 1.010–1.025)
UROBILINOGEN UA: 0.2 U/dL
pH, UA: 6.5 (ref 5.0–8.0)

## 2017-06-12 LAB — TIQ-MISC

## 2017-06-13 ENCOUNTER — Encounter: Payer: Self-pay | Admitting: Internal Medicine

## 2017-06-13 LAB — URINE CULTURE
MICRO NUMBER: 90028606
Result:: NO GROWTH
SPECIMEN QUALITY:: ADEQUATE

## 2017-06-13 NOTE — Patient Instructions (Signed)
Your abdominal film shows constipation.  Take MiraLAX on a daily basis and see if constipation improves.  Should be having bowel movements daily.

## 2017-06-13 NOTE — Progress Notes (Signed)
   Subjective:    Patient ID: Carrie SchwabFelicia Mosley, female    DOB: Jul 09, 1960, 57 y.o.   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  Is been having left lower quadrant abdominal pain for several days.  Has some intermittent issues with constipation.  Tries not to take laxatives.  No fever or chills.  Never had episode of diverticulitis before.  No urinary symptoms.  Urine dipstick shows trace occult blood which was present 10 months ago.  Not sexually active.      Review of Systems no nausea or vomiting     Objective:   Physical Exam  Skin warm and dry.  No acute distress.  Vital signs reviewed.  Abdomen is soft nondistended without hepatosplenomegaly or masses.  Difficulty identifying tenderness in left lower quadrant.  There was one area that was slightly tender but there is no rebound tenderness in the amount of tenderness is not impressive for an acute abdomen situation.      Assessment & Plan:  Left lower quadrant abdominal pain-suspect constipation  Plan: She is to have KUB abdominal film for further evaluation.  Addendum: KUB indeed shows constipation.  She is to take MiraLAX on a daily basis and see if symptoms improve.

## 2017-07-27 ENCOUNTER — Ambulatory Visit: Payer: BLUE CROSS/BLUE SHIELD | Admitting: Internal Medicine

## 2017-07-27 ENCOUNTER — Encounter: Payer: Self-pay | Admitting: Internal Medicine

## 2017-07-27 VITALS — BP 120/70 | HR 93 | Temp 99.8°F | Ht 64.0 in | Wt 169.0 lb

## 2017-07-27 DIAGNOSIS — J069 Acute upper respiratory infection, unspecified: Secondary | ICD-10-CM

## 2017-07-27 MED ORDER — FLUCONAZOLE 150 MG PO TABS: 150.0000 mg | ORAL_TABLET | Freq: Once | ORAL | 0 refills | Status: DC

## 2017-07-27 MED ORDER — AZITHROMYCIN 250 MG PO TABS: ORAL_TABLET | ORAL | 0 refills | Status: DC

## 2017-07-27 MED ORDER — HYDROCODONE-HOMATROPINE 5-1.5 MG/5ML PO SYRP: 5.0000 mL | ORAL_SOLUTION | Freq: Three times a day (TID) | ORAL | 0 refills | Status: DC | PRN

## 2017-07-28 MED ORDER — FLUCONAZOLE 150 MG PO TABS: 150.0000 mg | ORAL_TABLET | Freq: Once | ORAL | 0 refills | Status: AC

## 2017-07-28 NOTE — Patient Instructions (Signed)
Rest and drink plenty of fluids.  Zithromax Z-Pak as directed.  Diflucan if needed for Candida vaginitis.  Hycodan every 8 hours if needed for cough.

## 2017-07-28 NOTE — Progress Notes (Signed)
   Subjective:    Patient ID: Domenic SchwabFelicia Hinojosa, female    DOB: 01-28-1961, 57 y.o.   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  Hopes to be on vacation early next week but has to work on this coming Sunday to flying to [place].  She has respiratory infection symptoms.  No documented fever or shaking chills but has had headache, head congestion,, slight sore throat.  Slight cough.    Review of Systems see above     Objective:   Physical Exam Pharynx is very slightly injected without exudate.  TMs are not red.  Neck is supple without adenopathy.  Chest clear to auscultation.       Assessment & Plan:  Acute URI-she does not think she can be out of work this coming Sunday due to new policy regarding attendance with airline.  Plan: Zithromax Z-Pak take 2 tablets day 1 followed by 1 tablet days 2 through 5.  Diflucan if needed for Candida vaginitis.  Hycodan 1 teaspoon p.o. every 8 hours as needed cough and sore throat pain.  Rest and drink plenty of fluids.

## 2017-09-10 ENCOUNTER — Other Ambulatory Visit: Payer: Self-pay | Admitting: Internal Medicine

## 2017-09-10 DIAGNOSIS — Z1231 Encounter for screening mammogram for malignant neoplasm of breast: Secondary | ICD-10-CM

## 2017-10-18 ENCOUNTER — Ambulatory Visit
Admission: RE | Admit: 2017-10-18 | Discharge: 2017-10-18 | Disposition: A | Discharge status: Home / Self Care | Payer: BLUE CROSS/BLUE SHIELD | Source: Ambulatory Visit | Attending: Internal Medicine | Admitting: Internal Medicine

## 2017-10-18 DIAGNOSIS — Z1231 Encounter for screening mammogram for malignant neoplasm of breast: Secondary | ICD-10-CM

## 2018-03-14 ENCOUNTER — Encounter: Payer: Self-pay | Admitting: Internal Medicine

## 2018-03-14 ENCOUNTER — Ambulatory Visit: Payer: BLUE CROSS/BLUE SHIELD | Admitting: Internal Medicine

## 2018-03-14 VITALS — BP 110/80 | HR 70 | Ht 64.0 in | Wt 169.0 lb

## 2018-03-14 DIAGNOSIS — M7122 Synovial cyst of popliteal space [Baker], left knee: Secondary | ICD-10-CM | POA: Diagnosis not present

## 2018-03-14 MED ORDER — MELOXICAM 15 MG PO TABS: 15.0000 mg | ORAL_TABLET | Freq: Every day | ORAL | 0 refills | Status: DC

## 2018-03-14 NOTE — Progress Notes (Signed)
   Subjective:    Patient ID: Carrie Mosley, female    DOB: [birth year], 57 y.o.   MRN: [id number]  HPI Has been experiencing pain behind her knees in the popliteal areas bilaterally.  She is a Financial controller.  She does not exercise as much as she used to.  Travels internationally.  History of superficial varicosities both legs.  Pain is been more severe recently.  She is concerned about a clot.    Review of Systems see above     Objective:   Physical Exam  She has bilateral popliteal cysts that are slightly tender particularly on the left lower extremity.  Homans sign is negative.  There is no pitting edema or erythema of the lower extremities.     Assessment & Plan:  Bilateral popliteal cyst left larger than right.  Plan: She is leaving for [place] today.  I have prescribed meloxicam 15 mg daily to take for pain.  If symptoms persist, she will need to see orthopedist.  15 minutes spent with patient.

## 2018-03-14 NOTE — Patient Instructions (Addendum)
Trial of Meloxicam daily with a meal.  If symptoms persist, will need to see orthopedist.

## 2018-10-16 ENCOUNTER — Ambulatory Visit (INDEPENDENT_AMBULATORY_CARE_PROVIDER_SITE_OTHER): Payer: BLUE CROSS/BLUE SHIELD | Admitting: Internal Medicine

## 2018-10-16 DIAGNOSIS — R197 Diarrhea, unspecified: Secondary | ICD-10-CM | POA: Diagnosis not present

## 2018-10-17 ENCOUNTER — Encounter: Payer: Self-pay | Admitting: Internal Medicine

## 2018-10-17 NOTE — Patient Instructions (Signed)
Cipro 500 mg twice daily for 7 days.  Flagyl 500 mg twice daily for 7 days.  Stay with clear liquids.  Avoid milk and milk products.  Advance diet slowly.  Stay well-hydrated.  If symptoms fail to improve, will need to collect stool studies.

## 2018-10-17 NOTE — Progress Notes (Signed)
   Subjective:    Patient ID: Carrie Mosley, female    DOB: 1961-05-24, 58 y.o.   MRN: [id number]  HPI 58 year old Female called urgently today regarding a 2-week history of diarrhea.  The office is closed today.  I took her emergency call and did not connect with video capability as the call came through the emergency line and was felt to be urgent.  She consents to this visit today in this format.  She is identified to be identified as a longstanding patient in this practice.  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  Does not plan to return to work until August.  2 weeks ago ate frozen chicken from [place] which she says she cooked well.  She is not sure what has caused the diarrhea but has several episodes daily.  She has not traveled.  There is no blood in the stool.  No fever.  No shaking chills.  In 2017 she had a 3-week history of diarrhea after onset of a respiratory infection.  Respiratory infection was not treated with antibiotics.  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  Had not traveled overseas.  She was treated with Cipro and Flagyl and subsequently improved.  She thinks she may have lactose intolerance.  Was advised to avoid dairy products.  She has also had constipation intermittently in the past.  She had a normal screening colonoscopy in 2015.  No nausea or vomiting. Multiple stools daily especially after eating.         Review of Systems see above- saus abdomen is not bloated or tender.     Objective:   Physical Exam  On examined due to nature of visit      Assessment & Plan:  Diarrhea-considerations include viral gastroenteritis, bacterial gastroenteritis, doubt antibiotic associated gastroenteritis because she has not recently been on antibiotics, inflammatory bowel disease, lactose intolerance but has not always consume lactose when she has diarrhea, irritable bowel syndrome.  Plan: Since she correlates onset of  diarrhea after eating frozen chicken which apparently was cooked well, we are going to consider possibility of bacterial gastroenteritis and treat with Cipro and Flagyl which she responded well to previously.  She will stay with clear liquids and avoid milk and milk products.  She will advance diet slowly.  If symptoms persist after treatment with antibiotics, she will need to collect stool studies.  She was advised to stay well-hydrated.  Call if symptoms worsen or do not improve.  Plan: Medical decision making and time spent with patient including time spent reviewing records, history taking, giving thoughtful consideration to differential diagnoses and decision to treat due to prolonged symptomatology.  Decision made to treat patient due to possible bacterial gastroenteritis based on history and symptoms.-Time spent 20 minutes.

## 2019-03-07 ENCOUNTER — Encounter: Payer: BLUE CROSS/BLUE SHIELD | Admitting: Internal Medicine

## 2019-03-19 ENCOUNTER — Other Ambulatory Visit: Payer: Self-pay | Admitting: Internal Medicine

## 2019-03-19 DIAGNOSIS — Z1231 Encounter for screening mammogram for malignant neoplasm of breast: Secondary | ICD-10-CM

## 2019-04-07 ENCOUNTER — Other Ambulatory Visit: Payer: BC Managed Care – PPO | Admitting: Internal Medicine

## 2019-04-07 ENCOUNTER — Other Ambulatory Visit: Payer: Self-pay

## 2019-04-07 DIAGNOSIS — Z0000 Encounter for general adult medical examination without abnormal findings: Secondary | ICD-10-CM

## 2019-04-07 DIAGNOSIS — K580 Irritable bowel syndrome with diarrhea: Secondary | ICD-10-CM

## 2019-04-07 DIAGNOSIS — E739 Lactose intolerance, unspecified: Secondary | ICD-10-CM

## 2019-04-07 DIAGNOSIS — Z1321 Encounter for screening for nutritional disorder: Secondary | ICD-10-CM

## 2019-04-07 DIAGNOSIS — Z1329 Encounter for screening for other suspected endocrine disorder: Secondary | ICD-10-CM

## 2019-04-07 DIAGNOSIS — E782 Mixed hyperlipidemia: Secondary | ICD-10-CM

## 2019-04-08 ENCOUNTER — Ambulatory Visit (INDEPENDENT_AMBULATORY_CARE_PROVIDER_SITE_OTHER): Payer: BC Managed Care – PPO | Admitting: Internal Medicine

## 2019-04-08 ENCOUNTER — Other Ambulatory Visit (HOSPITAL_COMMUNITY)
Admission: RE | Admit: 2019-04-08 | Discharge: 2019-04-08 | Disposition: A | Discharge status: Home / Self Care | Payer: BC Managed Care – PPO | Source: Ambulatory Visit | Attending: Internal Medicine | Admitting: Internal Medicine

## 2019-04-08 ENCOUNTER — Encounter: Payer: Self-pay | Admitting: Internal Medicine

## 2019-04-08 VITALS — BP 140/90 | HR 86 | Ht 64.0 in | Wt 166.0 lb

## 2019-04-08 DIAGNOSIS — Z23 Encounter for immunization: Secondary | ICD-10-CM

## 2019-04-08 DIAGNOSIS — E7800 Pure hypercholesterolemia, unspecified: Secondary | ICD-10-CM

## 2019-04-08 DIAGNOSIS — Z0000 Encounter for general adult medical examination without abnormal findings: Secondary | ICD-10-CM | POA: Diagnosis not present

## 2019-04-08 DIAGNOSIS — K580 Irritable bowel syndrome with diarrhea: Secondary | ICD-10-CM | POA: Diagnosis not present

## 2019-04-08 DIAGNOSIS — Z124 Encounter for screening for malignant neoplasm of cervix: Secondary | ICD-10-CM

## 2019-04-08 LAB — COMPLETE METABOLIC PANEL WITH GFR
AG Ratio: 1.4 (calc) (ref 1.0–2.5)
ALT: 9 U/L (ref 6–29)
AST: 15 U/L (ref 10–35)
Albumin: 4.3 g/dL (ref 3.6–5.1)
Alkaline phosphatase (APISO): 114 U/L (ref 37–153)
BUN: 11 mg/dL (ref 7–25)
CO2: 22 mmol/L (ref 20–32)
Calcium: 9.7 mg/dL (ref 8.6–10.4)
Chloride: 105 mmol/L (ref 98–110)
Creat: 0.82 mg/dL (ref 0.50–1.05)
GFR, Est African American: 93 mL/min/{1.73_m2} (ref >=60)
GFR, Est Non African American: 80 mL/min/{1.73_m2} (ref >=60)
Globulin: 3 g/dL (calc) (ref 1.9–3.7)
Glucose, Bld: 79 mg/dL (ref 65–99)
Potassium: 3.9 mmol/L (ref 3.5–5.3)
Sodium: 139 mmol/L (ref 135–146)
Total Bilirubin: 0.6 mg/dL (ref 0.2–1.2)
Total Protein: 7.3 g/dL (ref 6.1–8.1)

## 2019-04-08 LAB — CBC WITH DIFFERENTIAL/PLATELET
Absolute Monocytes: 198 cells/uL — ABNORMAL LOW (ref 200–950)
Basophils Absolute: 20 cells/uL (ref 0–200)
Basophils Relative: 0.6 %
Eosinophils Absolute: 89 cells/uL (ref 15–500)
Eosinophils Relative: 2.7 %
HCT: 42.5 % (ref 35.0–45.0)
Hemoglobin: 14.1 g/dL (ref 11.7–15.5)
Lymphs Abs: 1165 cells/uL (ref 850–3900)
MCH: 27.5 pg (ref 27.0–33.0)
MCHC: 33.2 g/dL (ref 32.0–36.0)
MCV: 82.8 fL (ref 80.0–100.0)
MPV: 11.8 fL (ref 7.5–12.5)
Monocytes Relative: 6 %
Neutro Abs: 1828 cells/uL (ref 1500–7800)
Neutrophils Relative %: 55.4 %
Platelets: 167 10*3/uL (ref 140–400)
RBC: 5.13 10*6/uL — ABNORMAL HIGH (ref 3.80–5.10)
RDW: 12.7 % (ref 11.0–15.0)
Total Lymphocyte: 35.3 %
WBC: 3.3 10*3/uL — ABNORMAL LOW (ref 3.8–10.8)

## 2019-04-08 LAB — POCT URINALYSIS DIPSTICK
Appearance: NEGATIVE
Bilirubin, UA: NEGATIVE
Blood, UA: NEGATIVE
Glucose, UA: NEGATIVE
Ketones, UA: NEGATIVE
Leukocytes, UA: NEGATIVE
Nitrite, UA: NEGATIVE
Odor: NEGATIVE
Protein, UA: NEGATIVE
Spec Grav, UA: 1.015 (ref 1.010–1.025)
Urobilinogen, UA: 0.2 E.U./dL
pH, UA: 7 (ref 5.0–8.0)

## 2019-04-08 LAB — LIPID PANEL
Cholesterol: 204 mg/dL — ABNORMAL HIGH (ref <200)
HDL: 61 mg/dL (ref >=50)
LDL Cholesterol (Calc): 120 mg/dL (calc) — ABNORMAL HIGH
Non-HDL Cholesterol (Calc): 143 mg/dL (calc) — ABNORMAL HIGH (ref <130)
Total CHOL/HDL Ratio: 3.3 (calc) (ref <5.0)
Triglycerides: 119 mg/dL (ref <150)

## 2019-04-08 LAB — TSH: TSH: 1.53 mIU/L (ref 0.40–4.50)

## 2019-04-08 LAB — VITAMIN D 25 HYDROXY (VIT D DEFICIENCY, FRACTURES): Vit D, 25-Hydroxy: 40 ng/mL (ref 30–100)

## 2019-04-08 MED ORDER — ROSUVASTATIN CALCIUM 5 MG PO TABS: 5.0000 mg | ORAL_TABLET | Freq: Every day | ORAL | 3 refills | Status: DC

## 2019-04-08 NOTE — Progress Notes (Signed)
Subjective:    Patient ID: Carrie Mosley, female    DOB: 1961-03-06, 58 y.o.   MRN: [id number]  HPI 58 year old Female for health maintenance exam and evaluation of medical issues.  History of irritable bowel syndrome and possible lactose intolerance.  Records from 2015 indicate she had nausea and abdominal cramping after eating dairy products.  Was treated for possible foodborne infection with Cipro for 3 days but was suspected of having lactose intolerance.  History of vitamin D deficiency.  She became menopausal in 2011.  Dr. Garwin Brothers is her GYN physician.  History of recurrent bacterial vaginosis but no episodes recently.  History of occasional migraine headaches.  History of benign cardiac murmur.  Kidney stone in 1989.  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  This is improved over the past few years.  Dr. Nonnie Done saw her in April 2012 and he felt she had eustachian tube dysfunction.  She was allergy tested in 2003 and there was no hypersensitivity to a screening panel of allergens.  At that time limited CT of the sinuses was obtained showing no sinus disease.  Social history: Single, never married.  No children.  Rarely drinks alcohol and does not smoke.  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  Currently on episodes from her job as a Catering manager.  Family history: Mother died with complications of diabetes.  No sisters.  Mother died of apparent septicemia.  Father died of heart failure in his 46s.  He has diabetes and coronary artery disease.    Review of Systems  Constitutional: Negative.   Respiratory: Negative.   Cardiovascular: Negative.   Gastrointestinal:       History of irritable bowel syndrome and possible lactose intolerance  Genitourinary: Negative.   Musculoskeletal: Negative.   Neurological:       History of migraine headache  Psychiatric/Behavioral: Negative.         Objective:   Physical Exam Vitals signs reviewed.  Constitutional:      General: She is not in acute distress.    Appearance: Normal appearance.  HENT:     Head: Normocephalic and atraumatic.     Right Ear: Tympanic membrane and ear canal normal.     Left Ear: Tympanic membrane and ear canal normal.     Nose: Nose normal.  Eyes:     General: No scleral icterus.    Extraocular Movements: Extraocular movements intact.     Conjunctiva/sclera: Conjunctivae normal.     Pupils: Pupils are equal, round, and reactive to light.  Neck:     Musculoskeletal: Neck supple. No neck rigidity.     Vascular: No carotid bruit.     Comments: No thyromegaly Cardiovascular:     Rate and Rhythm: Normal rate and regular rhythm.     Heart sounds: Normal heart sounds.  Pulmonary:     Effort: Pulmonary effort is normal.     Breath sounds: Normal breath sounds. No wheezing or rales.     Comments: Breasts without masses Abdominal:     General: Bowel sounds are normal.     Palpations: Abdomen is soft. There is no mass.     Tenderness: There is no abdominal tenderness. There is no guarding or rebound.  Musculoskeletal:     Right lower leg: No edema.     Left lower leg: No edema.  Lymphadenopathy:     Cervical: No cervical adenopathy.  Skin:  General: Skin is warm and dry.     Findings: No rash.  Neurological:     General: No focal deficit present.     Mental Status: She is alert and oriented to person, place, and time.     Cranial Nerves: No cranial nerve deficit.     Coordination: Coordination normal.     Gait: Gait normal.  Psychiatric:        Mood and Affect: Mood normal.        Behavior: Behavior normal.        Thought Content: Thought content normal.        Judgment: Judgment normal.    Pelvic exam: Pap smear taken.  No masses appreciated rectovaginal confirms.       Assessment & Plan:  Total and LDL cholesterol elevation- watches diet and exercises regularly- Start Crestor 5 mg  weekly and follow up with lipid and liver functions without OV in 3 months.  Would like to see LDL less than 100.  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

## 2019-04-08 NOTE — Patient Instructions (Addendum)
Start Crestor 5 mg weekly and follow up in 3 months for labs only.  It was a pleasure to see you today.  Stay safe and stay well.  Flu vaccine given.  Monitor blood pressure.

## 2019-04-11 LAB — CYTOLOGY - PAP
Comment: NEGATIVE
Diagnosis: NEGATIVE
High risk HPV: NEGATIVE

## 2019-05-07 ENCOUNTER — Other Ambulatory Visit: Payer: Self-pay

## 2019-05-07 ENCOUNTER — Ambulatory Visit
Admission: RE | Admit: 2019-05-07 | Discharge: 2019-05-07 | Disposition: A | Discharge status: Home / Self Care | Payer: BLUE CROSS/BLUE SHIELD | Source: Ambulatory Visit | Attending: Internal Medicine | Admitting: Internal Medicine

## 2019-05-07 DIAGNOSIS — Z1231 Encounter for screening mammogram for malignant neoplasm of breast: Secondary | ICD-10-CM

## 2019-07-17 ENCOUNTER — Other Ambulatory Visit: Payer: Self-pay

## 2019-07-17 ENCOUNTER — Encounter: Payer: Self-pay | Admitting: Internal Medicine

## 2019-07-17 ENCOUNTER — Ambulatory Visit: Payer: BC Managed Care – PPO | Admitting: Internal Medicine

## 2019-07-17 VITALS — BP 120/80 | HR 68 | Temp 97.7°F | Ht 64.0 in | Wt 162.0 lb

## 2019-07-17 DIAGNOSIS — M545 Low back pain, unspecified: Secondary | ICD-10-CM

## 2019-07-17 MED ORDER — CYCLOBENZAPRINE HCL 10 MG PO TABS: ORAL_TABLET | ORAL | 0 refills | Status: DC

## 2019-07-17 MED ORDER — MELOXICAM 15 MG PO TABS: 15.0000 mg | ORAL_TABLET | Freq: Every day | ORAL | 0 refills | Status: DC

## 2019-07-28 ENCOUNTER — Other Ambulatory Visit: Payer: BC Managed Care – PPO | Admitting: Internal Medicine

## 2019-07-28 ENCOUNTER — Other Ambulatory Visit: Payer: Self-pay

## 2019-07-28 DIAGNOSIS — E7800 Pure hypercholesterolemia, unspecified: Secondary | ICD-10-CM

## 2019-07-28 LAB — LIPID PANEL
Cholesterol: 173 mg/dL (ref <200)
HDL: 61 mg/dL (ref >=50)
LDL Cholesterol (Calc): 94 mg/dL (calc)
Non-HDL Cholesterol (Calc): 112 mg/dL (calc) (ref <130)
Total CHOL/HDL Ratio: 2.8 (calc) (ref <5.0)
Triglycerides: 86 mg/dL (ref <150)

## 2019-07-28 LAB — HEPATIC FUNCTION PANEL
AG Ratio: 1.5 (calc) (ref 1.0–2.5)
ALT: 10 U/L (ref 6–29)
AST: 24 U/L (ref 10–35)
Albumin: 4.2 g/dL (ref 3.6–5.1)
Alkaline phosphatase (APISO): 105 U/L (ref 37–153)
Globulin: 2.8 g/dL (calc) (ref 1.9–3.7)
Total Bilirubin: 0.9 mg/dL (ref 0.2–1.2)
Total Protein: 7 g/dL (ref 6.1–8.1)

## 2019-08-03 NOTE — Progress Notes (Signed)
   Subjective:    Patient ID: Carrie Mosley, female    DOB: January 26, 1961, 59 y.o.   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  May have overexerted recently with some housework and lifting heavy furniture.  Future options discussed.  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  She has a Masters degree  Review of Systems no fever chills nausea vomiting or other COVID-19 symptoms.  No UTI symptoms.     Objective:   Physical Exam Vital signs reviewed.  She is afebrile.  Currently in no acute distress but has pain with flexion and twisting of trunk  She has palpable muscle spasm in her lower back.  Straight leg raising is negative at 90 degrees bilaterally.  Muscle strength is normal in the lower extremities.      Assessment & Plan:  Back pain-musculoskeletal pain from overexertion/heavy lifting  Some anxiety about the future.  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  Take with food.  Flexeril 10 mg 1/2 to 1 tablet at night for muscle spasm.  May apply heat or ice to the back area.  If not improving we can refer to physical therapy.  20 minutes spent with patient

## 2019-08-03 NOTE — Patient Instructions (Signed)
Mobic 15 mg daily.  May apply heat or ice to low back as needed.  Flexeril 10 mg 1/2 to 1 tablet at bedtime.  If pain not resolving in a few days consider physical therapy.

## 2019-09-18 ENCOUNTER — Telehealth: Payer: Self-pay | Admitting: Internal Medicine

## 2019-09-18 NOTE — Telephone Encounter (Signed)
We cannot get an MRI until she goes to PT for 3 weeks. Not sure what I would do at this point. Can see her tomorrow or we can arrange for PT

## 2019-09-18 NOTE — Telephone Encounter (Signed)
Scheduled appointment will discuss PT then

## 2019-09-18 NOTE — Telephone Encounter (Signed)
Carrie Mosley [phone]  Mosetta called to say she is still having back pain, it gets better at times. Would like to come in.

## 2019-09-19 ENCOUNTER — Ambulatory Visit: Payer: BC Managed Care – PPO | Admitting: Internal Medicine

## 2019-09-19 ENCOUNTER — Encounter: Payer: Self-pay | Admitting: Internal Medicine

## 2019-09-19 ENCOUNTER — Other Ambulatory Visit: Payer: Self-pay

## 2019-09-19 VITALS — BP 130/80 | HR 103 | Temp 98.7°F | Ht 64.0 in | Wt 166.0 lb

## 2019-09-19 DIAGNOSIS — M545 Low back pain, unspecified: Secondary | ICD-10-CM

## 2019-09-19 DIAGNOSIS — G8929 Other chronic pain: Secondary | ICD-10-CM

## 2019-09-19 MED ORDER — PREDNISONE 10 MG PO TABS: ORAL_TABLET | ORAL | 0 refills | Status: DC

## 2019-09-21 NOTE — Patient Instructions (Signed)
Take prednisone in tapering course as directed.  If not improving will refer to physical therapy.

## 2019-09-21 NOTE — Progress Notes (Signed)
   Subjective:    Patient ID: Carrie Mosley, female    DOB: 11-14-1960, 59 y.o.   MRN: [id number]  HPI 59 year old Female seen February 11 with back pain.  Had lifted a piece of heavy furniture and was doing housework at that time.  Still has left-sided low back pain.  In February was treated with Mobic 15 mg daily and Flexeril.  Noted if not improved we could refer to physical therapy.  It seems that back pain has persisted.  Seems to be uncomfortable at night and at other times.  Denies radiculopathy symptoms.    Review of Systems see above     Objective:   Physical Exam Vital signs reviewed.  Straight leg raising is negative at 90 degrees on the left and muscle strength in the left lower extremity is normal.  She points to her left mid to lower back area as a source of pain.       Assessment & Plan:  Persistent low back pain since February.  Plan: Try prednisone 10 mg (number 21 tablets) going from 60 mg to 0 mg over 7 days.  If not improving we can refer to physical therapy.  Explained to patient MRI of the LS spine cannot be obtained until she has had 3 weeks of physical therapy due to insurance policies.  Apply heat or ice to the low back area.

## 2019-11-10 ENCOUNTER — Telehealth: Payer: Self-pay | Admitting: Internal Medicine

## 2019-11-10 NOTE — Telephone Encounter (Signed)
Referral and written order faxed.

## 2019-11-10 NOTE — Telephone Encounter (Signed)
Jakari Jacot [phone]  Tonna called to say she is still having some lower back pain, so maybe it is time to go to physical therapy, she is going to call and make sure it is coved with her insurance and call us back.

## 2019-11-10 NOTE — Telephone Encounter (Signed)
[facility] are 2 that are covered thru her insurance.

## 2019-11-10 NOTE — Telephone Encounter (Signed)
To [facility].

## 2019-11-10 NOTE — Telephone Encounter (Signed)
OK will await her call back

## 2020-01-05 ENCOUNTER — Telehealth: Payer: Self-pay | Admitting: Internal Medicine

## 2020-01-05 NOTE — Telephone Encounter (Signed)
Carrie Mosley [phone]  Carrie Mosley called to say she would like to get an MRI on her back she is still having problems and she finished Physical Therapy back in June.

## 2020-01-05 NOTE — Telephone Encounter (Signed)
scheduled

## 2020-01-05 NOTE — Telephone Encounter (Signed)
She needs OV later this week

## 2020-01-05 NOTE — Telephone Encounter (Signed)
LVM to CB and schedule OV 

## 2020-01-08 ENCOUNTER — Encounter: Payer: Self-pay | Admitting: Internal Medicine

## 2020-01-08 ENCOUNTER — Other Ambulatory Visit: Payer: Self-pay

## 2020-01-08 ENCOUNTER — Ambulatory Visit: Payer: BC Managed Care – PPO | Admitting: Internal Medicine

## 2020-01-08 VITALS — BP 100/70 | HR 72 | Ht 64.0 in | Wt 164.0 lb

## 2020-01-08 DIAGNOSIS — M545 Low back pain, unspecified: Secondary | ICD-10-CM

## 2020-01-08 MED ORDER — MELOXICAM 15 MG PO TABS: 15.0000 mg | ORAL_TABLET | Freq: Every day | ORAL | 0 refills | Status: DC

## 2020-01-08 NOTE — Progress Notes (Signed)
Subjective:    Patient ID: Carrie Mosley, female    DOB: [birth year], 59 y.o.   MRN: [id number]  HPI  59 year old Female has had issues with acute low back pain since Feb 2021.  She was seen here July 17, 2019 complaining of back pain after doing some housework and lifting some heavy furniture.  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  At that time she was seen in February she had palpable muscle spasm in her lower back.  Straight leg raising was negative at 90 degrees bilaterally and her muscle strength was normal.  She was placed on Lopid 15 mg daily and Flexeril 10 mg to take 1/2 to 1 tablet at night as needed.  Advised to apply heat or ice to her back and if not improving we could refer her to physical therapy.  She telephoned again on September 18, 2019 saying she was still having back pain.  We saw her on April 16 at which time she was complaining of persistent left-sided back pain.  Once again straight leg raising was negative at 90 degrees on the left and muscle strength in the left lower extremity was normal.  Still having pain left needed to lower left back area.  She was treated with prednisone 10 mg tablets (#21) going from 60 mg to 0 mg over 7 days.  Explained that she would need to go to physical therapy for minimum of 3 weeks for an MRI would likely be approved by [employer].  She called back in June and we referred her to physical therapy.  She called on August 2 requesting MRI of her back saying she had finished physical therapy in June.  Office visit here was advised and we saw her today.  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  [employer] is only going to allow her to defer her furlough for a few more months.  Have received end of care note from [facility] physical therapy dated December 10, 2019.  It says patient attended a total of 6  treatments for low back pain focusing on decreased functional activities of daily living status and sleeping difficulty.  Complains of moderate stiffness, loss of motion and moderate pain.  Pain was noted to be in the midline lower back and anterior groin.  She did undergo some dry needling which helped a bit.  Has been taking some anti-inflammatory medication over-the-counter for soreness.  Her general health is excellent.  She takes Crestor once weekly for mild hyperlipidemia.  Social history: Single, never married.  No children.  Rarely drinks alcohol and does not smoke.  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  Has been a flight attendant for many years.  Family history: Brother died with complications of diabetes.  No sisters.  Mother died of apparent septicemia.  Father died of heart failure in his 53s.  He had diabetes and coronary artery disease.  Review of Systems see above     Objective:   Physical Exam Blood pressure 100/70 pulse 72 pulse oximetry 96% weight 164 pounds BMI 28.15 Straight leg raising today is negative at 90 degrees bilaterally.  Muscle strength in the left lower extremity is normal.  Deep tendon reflex 2+ in the knee.  Range of motion in the trunk is decreased  with flexion and extension.  This causes pain.      Assessment & Plan:  Persistent left lower back pain despite conservative therapy including physical therapy  Plan: I have ordered MRI of the LS spine for further evaluation of her persistent left lower back pain which has been ongoing since February.  I  have prescribed Mobic 15 mg daily with food for persistent soreness and decreased range of motion.  We have also drawn a CCP, sed rate and ANA due to persistent pain.

## 2020-01-08 NOTE — Patient Instructions (Signed)
It was a pleasure to see you today.  I have ordered an MRI of the LS spine since she has been to physical therapy and have failed to improve with regard to left lower back pain.  Try Mobic 15 mg daily.  Avoid heavy lifting and heavy exercise.

## 2020-01-14 LAB — CYCLIC CITRUL PEPTIDE ANTIBODY, IGG: Cyclic Citrullin Peptide Ab: <16 UNITS

## 2020-01-14 LAB — ANTI-DNA ANTIBODY, DOUBLE-STRANDED: ds DNA Ab: <1 IU/mL

## 2020-01-14 LAB — ANTI-NUCLEAR AB-TITER (ANA TITER): ANA Titer 1: 1:40 {titer} — ABNORMAL HIGH

## 2020-01-14 LAB — TEST AUTHORIZATION

## 2020-01-14 LAB — ANA: Anti Nuclear Antibody (ANA): POSITIVE — AB

## 2020-01-14 LAB — ANTI-SMITH ANTIBODY: ENA SM Ab Ser-aCnc: <1 AI

## 2020-01-14 LAB — SEDIMENTATION RATE: Sed Rate: 22 mm/h (ref 0–30)

## 2020-01-29 DIAGNOSIS — Z029 Encounter for administrative examinations, unspecified: Secondary | ICD-10-CM

## 2020-02-02 ENCOUNTER — Ambulatory Visit
Admission: RE | Admit: 2020-02-02 | Discharge: 2020-02-02 | Disposition: A | Discharge status: Home / Self Care | Payer: BC Managed Care – PPO | Source: Ambulatory Visit | Attending: Internal Medicine | Admitting: Internal Medicine

## 2020-02-02 ENCOUNTER — Other Ambulatory Visit: Payer: Self-pay

## 2020-02-02 DIAGNOSIS — M545 Low back pain, unspecified: Secondary | ICD-10-CM

## 2020-02-03 ENCOUNTER — Other Ambulatory Visit: Payer: Self-pay

## 2020-02-03 ENCOUNTER — Ambulatory Visit
Admission: RE | Admit: 2020-02-03 | Discharge: 2020-02-03 | Disposition: A | Discharge status: Home / Self Care | Payer: BC Managed Care – PPO | Source: Ambulatory Visit | Attending: Internal Medicine | Admitting: Internal Medicine

## 2020-02-03 ENCOUNTER — Encounter: Payer: Self-pay | Admitting: Internal Medicine

## 2020-02-03 ENCOUNTER — Telehealth: Payer: Self-pay | Admitting: Internal Medicine

## 2020-02-03 ENCOUNTER — Ambulatory Visit: Payer: BC Managed Care – PPO | Admitting: Internal Medicine

## 2020-02-03 VITALS — BP 102/70 | HR 69 | Ht 64.0 in | Wt 161.0 lb

## 2020-02-03 DIAGNOSIS — S9032XA Contusion of left foot, initial encounter: Secondary | ICD-10-CM

## 2020-02-03 DIAGNOSIS — M545 Low back pain, unspecified: Secondary | ICD-10-CM

## 2020-02-03 DIAGNOSIS — M79672 Pain in left foot: Secondary | ICD-10-CM | POA: Diagnosis not present

## 2020-02-03 DIAGNOSIS — G8929 Other chronic pain: Secondary | ICD-10-CM

## 2020-02-03 NOTE — Progress Notes (Signed)
   Subjective:    Patient ID: Carrie Mosley, female    DOB: January 11, 1961, 59 y.o.   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  In February of this year, she lifted some heavy furniture and was doing housework.  Was seen in the office with palpable muscle spasm in her lower back.  Straight leg raising was negative at 90 degrees bilaterally.  Was placed on Mobic and Flexeril.  She called in mid April saying she still had back pain.  Denied radiculopathy.  Exam was still unremarkable but she was complaining of pain in her left mid to lower back area as source of pain.  She was tried on prednisone in a tapering course for 7 days.  Explained to her it would be unlikely to get MRI approved unless she went to physical therapy.  She subsequently went to physical therapy and finished course of therapy in June of this year.  Was seen again in August complaining of persistent low back pain despite having had physical therapy and conservative treatment with Flexeril, meloxicam and a short course of prednisone.  Her general health is excellent.  She takes Crestor once weekly for mild hyperlipidemia.  She stays physically active.  Years ago she noted a Black belt in karate  A few days ago she dropped a heavy sweater on her left foot.  She is complaining of pain and tenderness over her first and second metatarsal area with some redness.  Review of Systems see above     Objective:   Physical Exam Blood pressure 102/70 pulse 69 pulse oximetry 97% weight 161 pounds Left foot dorsal aspect has redness between first and second metatarsals distally.  Tenderness to palpation noted.  X-ray negative for fracture of metatarsals.  Recent MRI of the LS spine reviewed with her today in detail.  Results indicate  mild relative right neural foraminal narrowing L3-L4 and facet arthrosis, right greater than left subarticular narrowing and  crowding of the descending L5 nerve root.  Mild bilateral neural foraminal narrowing.      Assessment & Plan:  Persistent lumbar back pain with right greater than left narrowing and crowding of L5 nerve roots.  Mild central canal and bilateral neural foraminal narrowing.  She has anxiety given persistent back pain.  I would like for her to see neurosurgeon.  She may be a candidate for epidural steroid injections.  Contusion dorsum left foot secondary to dropping heavy object on foot.  Plan: Continue with meloxicam.  Ace wrap was given for left foot.  She is to ice the foot for 20 minutes 2 or 3 times daily and keep it elevated for few days.  Arrange for referral to Dr. Danielle Dess.

## 2020-02-03 NOTE — Patient Instructions (Signed)
X-ray of left foot is negative for fracture.  Use Ace wrap and tennis shoe for a few days.  Apply ice and keep foot elevated if possible.  We are referring you to Dr. Barnett Abu to review your MRI and suggest treatment going forward.  He may continue with meloxicam for now.

## 2020-02-03 NOTE — Telephone Encounter (Signed)
Book OV- need to discuss MRI also

## 2020-02-03 NOTE — Telephone Encounter (Signed)
Carrie Mosley [phone]  [name] called to say on Saturday the top part of her shedded fell on the top of her left foot. The swelling and bruise has gone down, but this morning it seems to be hurting more.

## 2020-02-03 NOTE — Telephone Encounter (Signed)
scheduled

## 2020-04-16 ENCOUNTER — Other Ambulatory Visit: Payer: Self-pay | Admitting: Internal Medicine

## 2020-04-16 DIAGNOSIS — Z0000 Encounter for general adult medical examination without abnormal findings: Secondary | ICD-10-CM

## 2020-05-27 ENCOUNTER — Other Ambulatory Visit: Payer: Self-pay

## 2020-05-27 ENCOUNTER — Ambulatory Visit
Admission: RE | Admit: 2020-05-27 | Discharge: 2020-05-27 | Disposition: A | Discharge status: Home / Self Care | Payer: BC Managed Care – PPO | Source: Ambulatory Visit | Attending: Internal Medicine | Admitting: Internal Medicine

## 2020-05-27 DIAGNOSIS — Z0000 Encounter for general adult medical examination without abnormal findings: Secondary | ICD-10-CM

## 2020-05-31 ENCOUNTER — Ambulatory Visit: Payer: BC Managed Care – PPO

## 2020-10-15 ENCOUNTER — Telehealth: Payer: Self-pay

## 2020-10-15 NOTE — Telephone Encounter (Signed)
Patient will call orthopedist to make appt.

## 2020-10-15 NOTE — Telephone Encounter (Signed)
Patient has been having feet pain for a couple of weeks and would like to be seen.

## 2020-10-15 NOTE — Telephone Encounter (Signed)
I would suggest she see orthopedist regarding this.

## 2020-10-22 ENCOUNTER — Ambulatory Visit (INDEPENDENT_AMBULATORY_CARE_PROVIDER_SITE_OTHER): Payer: BC Managed Care – PPO

## 2020-10-22 ENCOUNTER — Ambulatory Visit: Payer: BC Managed Care – PPO | Admitting: Podiatry

## 2020-10-22 ENCOUNTER — Other Ambulatory Visit: Payer: Self-pay

## 2020-10-22 DIAGNOSIS — M7742 Metatarsalgia, left foot: Secondary | ICD-10-CM

## 2020-10-22 DIAGNOSIS — M21962 Unspecified acquired deformity of left lower leg: Secondary | ICD-10-CM | POA: Diagnosis not present

## 2020-10-22 DIAGNOSIS — M7741 Metatarsalgia, right foot: Secondary | ICD-10-CM | POA: Diagnosis not present

## 2020-10-22 DIAGNOSIS — M21961 Unspecified acquired deformity of right lower leg: Secondary | ICD-10-CM

## 2020-10-22 DIAGNOSIS — M79671 Pain in right foot: Secondary | ICD-10-CM

## 2020-10-22 DIAGNOSIS — M7750 Other enthesopathy of unspecified foot: Secondary | ICD-10-CM

## 2020-10-22 DIAGNOSIS — M79672 Pain in left foot: Secondary | ICD-10-CM

## 2020-10-22 NOTE — Progress Notes (Signed)
  Subjective:  Patient ID: Carrie Mosley, female    DOB: 02-10-1961,  MRN: [id number]  Chief Complaint  Patient presents with  . Foot Pain    Bilateral foot pain pt states she dropped something on her left foot and it hurts when she bends her toes as well as having plantar pain. Pt states that on her left foot she has plantar pain. Pt states she may have bilateral callus pain.    60 y.o. female presents with the above complaint. History confirmed with patient.   Objective:  Physical Exam: warm, good capillary refill, no trophic changes or ulcerative lesions, normal DP and PT pulses and normal sensory exam. HPKs submet 5 bilat, POP 2nd/3rd MPJs with fat pad atrophy.  No images are attached to the encounter.  Radiographs: X-ray of both feet: shortened 1st met bilat, no acute fractures or dislocations. Assessment:   1. Metatarsalgia of both feet   2. Capsulitis of metatarsophalangeal (MTP) joint   3. Metatarsal deformity, left   4. Metatarsal deformity, right    Plan:  Patient was evaluated and treated and all questions answered.  Metatarsalgia -Educated on etiology -XR reviewed with patient -Discussed padding and proper shoegear -Offloading pad applied  -Calluses debrided, courtesy  Return in about 4 weeks (around 11/19/2020) for Capsulitis.

## 2020-10-25 ENCOUNTER — Telehealth: Payer: Self-pay | Admitting: Internal Medicine

## 2020-10-25 ENCOUNTER — Other Ambulatory Visit: Payer: Self-pay

## 2020-10-25 ENCOUNTER — Ambulatory Visit: Payer: BC Managed Care – PPO | Admitting: Internal Medicine

## 2020-10-25 VITALS — HR 107 | Temp 99.3°F

## 2020-10-25 DIAGNOSIS — R059 Cough, unspecified: Secondary | ICD-10-CM | POA: Diagnosis not present

## 2020-10-25 DIAGNOSIS — U071 COVID-19: Secondary | ICD-10-CM | POA: Diagnosis not present

## 2020-10-25 DIAGNOSIS — R0989 Other specified symptoms and signs involving the circulatory and respiratory systems: Secondary | ICD-10-CM

## 2020-10-25 MED ORDER — HYDROCODONE BIT-HOMATROP MBR 5-1.5 MG/5ML PO SOLN: 5.0000 mL | Freq: Three times a day (TID) | ORAL | 0 refills | Status: DC | PRN

## 2020-10-25 MED ORDER — AZITHROMYCIN 250 MG PO TABS: ORAL_TABLET | ORAL | 0 refills | Status: AC

## 2020-10-25 MED ORDER — FLUCONAZOLE 150 MG PO TABS: 150.0000 mg | ORAL_TABLET | Freq: Once | ORAL | 0 refills | Status: AC

## 2020-10-25 NOTE — Telephone Encounter (Signed)
Needs car visit but it is going to rain. When can we do this?

## 2020-10-25 NOTE — Telephone Encounter (Signed)
scheduled

## 2020-10-25 NOTE — Telephone Encounter (Signed)
Carrie Mosley [phone]  [name] called to say she is coughing and has head and nose congestion, sneezing, runny nose and headache, it started on Friday with scratchy throat or like a tickle in her throat. Took home COVID test on Saturday that was negative. Was not able to go to work today.

## 2020-10-26 LAB — SARS-COV-2 RNA,(COVID-19) QUALITATIVE NAAT: SARS CoV2 RNA: DETECTED — AB

## 2020-10-27 LAB — RESPIRATORY VIRUS PANEL

## 2020-10-28 ENCOUNTER — Telehealth: Payer: Self-pay | Admitting: Internal Medicine

## 2020-10-28 NOTE — Telephone Encounter (Signed)
Faxed Positive PCR results to [facility] [phone] 10/25/2020

## 2020-10-30 ENCOUNTER — Encounter: Payer: Self-pay | Admitting: Internal Medicine

## 2020-10-30 NOTE — Patient Instructions (Addendum)
Rest at home and drink plenty of fluids.  Need to quarantine until PCR COVID test is back. Take Zithromax Z Pak as directed and Hycodan every 8 hours as needed for cough.

## 2020-10-30 NOTE — Progress Notes (Signed)
   Subjective:    Patient ID: Carrie Mosley, female    DOB: 27-Jun-1960, 60 y.o.   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 Has noticed passengers are not wearing masks on flights. Has developed cough , rhinorrhea, and headache. Symptoms started with scratchy throat on Friday, May 20th. Took home Covid test the following day that was negative.Unable to go to work today because of malaise and other symptoms.  Patient was seen in person in the office.  Her general health is excellent.  She takes Crestor 5 mg for hyperlipidemia and is taking meloxicam as needed for musculoskeletal pain.    Review of Systems denies vomiting.  Has lethargy.     Objective:   Physical Exam  Temperature 99.3 degrees pulse 107 pulse oximetry 97%.  Looks fatigued.  TMs are clear.  Neck is supple.  Chest is clear to auscultation without rales or wheezing.  Respiratory virus panel and  COVID PCR test obtained.      Assessment & Plan:  Suspect COVID-19 virus infection with the symptoms  Plan: Patient is to quarantine until respiratory virus panel and COVID-19 PCR tests are back.  She will be treated with Zithromax Z-PAK 2 tabs day 1 followed by 1 tab days 2 through 5.  She will take Hycodan 1 teaspoon every 8 hours as needed for cough and sore throat pain.  Rest and drink plenty of fluids.  Out of work until test results are back and if COVID positive will be out of work 5 to 7 days.   Addendum: Has positive PCR Covid-19 test by nasal swab. Respiratory virus panel negative.Notified by phone of results and need to quarantine 5-7 days. Can provide note for work is needed.

## 2020-11-02 ENCOUNTER — Other Ambulatory Visit: Payer: Self-pay | Admitting: Podiatry

## 2020-11-02 DIAGNOSIS — M7741 Metatarsalgia, right foot: Secondary | ICD-10-CM

## 2021-03-09 ENCOUNTER — Other Ambulatory Visit: Payer: Self-pay | Admitting: Obstetrics and Gynecology

## 2021-03-09 DIAGNOSIS — Z1231 Encounter for screening mammogram for malignant neoplasm of breast: Secondary | ICD-10-CM

## 2021-03-09 DIAGNOSIS — Z780 Asymptomatic menopausal state: Secondary | ICD-10-CM

## 2021-03-21 ENCOUNTER — Emergency Department (HOSPITAL_BASED_OUTPATIENT_CLINIC_OR_DEPARTMENT_OTHER)
Admission: EM | Admit: 2021-03-21 | Discharge: 2021-03-21 | Disposition: A | Discharge status: Home / Self Care | Payer: BC Managed Care – PPO | Attending: Emergency Medicine | Admitting: Emergency Medicine

## 2021-03-21 ENCOUNTER — Encounter: Payer: Self-pay | Admitting: Internal Medicine

## 2021-03-21 ENCOUNTER — Telehealth: Payer: Self-pay | Admitting: Internal Medicine

## 2021-03-21 ENCOUNTER — Emergency Department (HOSPITAL_BASED_OUTPATIENT_CLINIC_OR_DEPARTMENT_OTHER): Payer: BC Managed Care – PPO

## 2021-03-21 ENCOUNTER — Other Ambulatory Visit: Payer: Self-pay

## 2021-03-21 ENCOUNTER — Encounter (HOSPITAL_BASED_OUTPATIENT_CLINIC_OR_DEPARTMENT_OTHER): Payer: Self-pay | Admitting: Emergency Medicine

## 2021-03-21 DIAGNOSIS — R1032 Left lower quadrant pain: Secondary | ICD-10-CM | POA: Diagnosis present

## 2021-03-21 LAB — CBC WITH DIFFERENTIAL/PLATELET
Abs Immature Granulocytes: 0.01 10*3/uL (ref 0.00–0.07)
Basophils Absolute: 0 10*3/uL (ref 0.0–0.1)
Basophils Relative: 1 %
Eosinophils Absolute: 0.1 10*3/uL (ref 0.0–0.5)
Eosinophils Relative: 1 %
HCT: 45.3 % (ref 36.0–46.0)
Hemoglobin: 14.9 g/dL (ref 12.0–15.0)
Immature Granulocytes: 0 %
Lymphocytes Relative: 35 %
Lymphs Abs: 1.3 10*3/uL (ref 0.7–4.0)
MCH: 27.7 pg (ref 26.0–34.0)
MCHC: 32.9 g/dL (ref 30.0–36.0)
MCV: 84.2 fL (ref 80.0–100.0)
Monocytes Absolute: 0.2 10*3/uL (ref 0.1–1.0)
Monocytes Relative: 6 %
Neutro Abs: 2.1 10*3/uL (ref 1.7–7.7)
Neutrophils Relative %: 57 %
Platelets: 182 10*3/uL (ref 150–400)
RBC: 5.38 MIL/uL — ABNORMAL HIGH (ref 3.87–5.11)
RDW: 12.6 % (ref 11.5–15.5)
WBC: 3.7 10*3/uL — ABNORMAL LOW (ref 4.0–10.5)
nRBC: 0 % (ref 0.0–0.2)

## 2021-03-21 LAB — COMPREHENSIVE METABOLIC PANEL
ALT: 10 U/L (ref 0–44)
AST: 16 U/L (ref 15–41)
Albumin: 4.6 g/dL (ref 3.5–5.0)
Alkaline Phosphatase: 103 U/L (ref 38–126)
Anion gap: 10 (ref 5–15)
BUN: 12 mg/dL (ref 6–20)
CO2: 27 mmol/L (ref 22–32)
Calcium: 9.9 mg/dL (ref 8.9–10.3)
Chloride: 103 mmol/L (ref 98–111)
Creatinine, Ser: 0.78 mg/dL (ref 0.44–1.00)
GFR, Estimated: >60 mL/min (ref >60)
Glucose, Bld: 87 mg/dL (ref 70–99)
Potassium: 3.5 mmol/L (ref 3.5–5.1)
Sodium: 140 mmol/L (ref 135–145)
Total Bilirubin: 0.8 mg/dL (ref 0.3–1.2)
Total Protein: 7.9 g/dL (ref 6.5–8.1)

## 2021-03-21 LAB — URINALYSIS, ROUTINE W REFLEX MICROSCOPIC
Bilirubin Urine: NEGATIVE
Glucose, UA: NEGATIVE mg/dL
Hgb urine dipstick: NEGATIVE
Ketones, ur: NEGATIVE mg/dL
Leukocytes,Ua: NEGATIVE
Nitrite: NEGATIVE
Protein, ur: NEGATIVE mg/dL
Specific Gravity, Urine: <1.005 — ABNORMAL LOW (ref 1.005–1.030)
pH: 6 (ref 5.0–8.0)

## 2021-03-21 NOTE — ED Provider Notes (Signed)
[facility] EMERGENCY DEPT Provider Note   CSN: [id number] Arrival date & time: 03/21/21  1412     History Chief Complaint  Patient presents with   Pelvic Pain    Carrie Mosley is a 60 y.o. female.  Patient is a 60 year old female who presents with pain in her left lower abdomen.  She says that she has been having this pain for about 3 to 4 weeks.  She says it is intermittent.  She currently denies any pain.  She denies any associated nausea or vomiting.  No urinary symptoms.  No change in her stools.  No fevers.  No vaginal bleeding or discharge.  She recently had a Pap smear on October 3 by Dr. Cherly Hensen.  She says at that time she had a pelvic ultrasound that Dr. Cherly Hensen felt was normal and she had been recommended to follow-up with her PCP for possible abdominal etiology for her symptoms.  She has not been able to get an appointment and came here for further evaluation.      Past Medical History:  Diagnosis Date   Menopause    Migraine    Varicose veins    Vitamin D deficiency     Patient Active Problem List   Diagnosis Date Noted   Spider veins of both lower extremities 10/19/2015   Varicose veins of both lower extremities with complications 10/08/2015   Pain in shoulder 03/09/2014   Musculoskeletal chest pain 02/08/2012   Allergic rhinitis 08/28/2011   H/O Eustachian tube dysfunction 08/13/2011   History of vitamin D deficiency 08/13/2011   Bacterial vaginosis 04/05/2011    History reviewed. No pertinent surgical history.   OB History   No obstetric history on file.     Family History  Problem Relation Age of Onset   Heart disease Father    Diabetes Father    Diabetes Brother    Breast cancer Maternal Aunt     Social History   Tobacco Use   Smoking status: Never   Smokeless tobacco: Never  Substance Use Topics   Alcohol use: Yes    Alcohol/week: 0.0 standard drinks   Drug use: No    Home Medications Prior to Admission  medications   Medication Sig Start Date End Date Taking? Authorizing Provider  cholecalciferol (VITAMIN D) 1000 UNITS tablet Take 1,000 Units by mouth daily.    [provider]  Echinacea 125 MG CAPS Take 1 capsule by mouth every other day.    [provider]  HYDROcodone bit-homatropine (HYCODAN) 5-1.5 MG/5ML syrup Take 5 mLs by mouth every 8 (eight) hours as needed for cough. 10/25/20   Margaree Mackintosh, MD  meloxicam (MOBIC) 15 MG tablet Take 1 tablet (15 mg total) by mouth daily. 01/08/20   Margaree Mackintosh, MD  Multiple Vitamin (MULTIVITAMIN) capsule Take 1 capsule by mouth daily.    [provider]  Probiotic Product ([employer] ADV PROBIOTIC GUMMIES) CHEW Chew by mouth.    [provider]  rosuvastatin (CRESTOR) 5 MG tablet Take 1 tablet (5 mg total) by mouth daily. 04/08/19   Margaree Mackintosh, MD    Allergies    Patient has no known allergies.  Review of Systems   Review of Systems  Constitutional:  Negative for chills, diaphoresis, fatigue and fever.  HENT:  Negative for congestion, rhinorrhea and sneezing.   Eyes: Negative.   Respiratory:  Negative for cough, chest tightness and shortness of breath.   Cardiovascular:  Negative for chest pain and  leg swelling.  Gastrointestinal:  Positive for abdominal pain. Negative for blood in stool, diarrhea, nausea and vomiting.  Genitourinary:  Negative for difficulty urinating, flank pain, frequency and hematuria.  Musculoskeletal:  Negative for arthralgias and back pain.  Skin:  Negative for rash.  Neurological:  Negative for dizziness, speech difficulty, weakness, numbness and headaches.   Physical Exam Updated Vital Signs BP 118/64   Pulse 79   Temp 98.3 F (36.8 C)   Resp 20   Ht 5\' 4"  (1.626 m)   Wt 65.8 kg   LMP 06/05/2010   SpO2 100%   BMI 24.89 kg/m   Physical Exam Constitutional:      Appearance: She is well-developed.  HENT:     Head: Normocephalic and atraumatic.  Eyes:     Pupils: Pupils  are equal, round, and reactive to light.  Cardiovascular:     Rate and Rhythm: Normal rate and regular rhythm.     Heart sounds: Normal heart sounds.  Pulmonary:     Effort: Pulmonary effort is normal. No respiratory distress.     Breath sounds: Normal breath sounds. No wheezing or rales.  Chest:     Chest wall: No tenderness.  Abdominal:     General: Bowel sounds are normal.     Palpations: Abdomen is soft.     Tenderness: There is no abdominal tenderness. There is no guarding or rebound.  Musculoskeletal:        General: Normal range of motion.     Cervical back: Normal range of motion and neck supple.  Lymphadenopathy:     Cervical: No cervical adenopathy.  Skin:    General: Skin is warm and dry.     Findings: No rash.  Neurological:     Mental Status: She is alert and oriented to person, place, and time.    ED Results / Procedures / Treatments   Labs (all labs ordered are listed, but only abnormal results are displayed) Labs Reviewed  CBC WITH DIFFERENTIAL/PLATELET - Abnormal; Notable for the following components:      Result Value   WBC 3.7 (*)    RBC 5.38 (*)    All other components within normal limits  URINALYSIS, ROUTINE W REFLEX MICROSCOPIC - Abnormal; Notable for the following components:   Color, Urine COLORLESS (*)    Specific Gravity, Urine <1.005 (*)    All other components within normal limits  COMPREHENSIVE METABOLIC PANEL    EKG None  Radiology CT Renal Stone Study  Result Date: 03/21/2021 CLINICAL DATA:  Left lower quadrant pain and flank pain. EXAM: CT ABDOMEN AND PELVIS WITHOUT CONTRAST TECHNIQUE: Multidetector CT imaging of the abdomen and pelvis was performed following the standard protocol without IV contrast. COMPARISON:  None. FINDINGS: Lower chest: No acute abnormality. Hepatobiliary: No focal liver abnormality is seen. No gallstones, gallbladder wall thickening, or biliary dilatation. Pancreas: Unremarkable. No pancreatic ductal dilatation  or surrounding inflammatory changes. Spleen: Normal in size without focal abnormality. Adrenals/Urinary Tract: Adrenal glands are unremarkable. Kidneys are normal, without renal calculi, focal lesion, or hydronephrosis. Bladder is unremarkable. Stomach/Bowel: Stomach is within normal limits. Appendix appears normal. No evidence of bowel wall thickening, distention, or inflammatory changes. Vascular/Lymphatic: Aortic atherosclerosis. No enlarged abdominal or pelvic lymph nodes. Reproductive: There is a questionable calcified left uterine fibroid, not well delineated, measuring 15 mm. The uterus and adnexa are otherwise within normal limits. Other: No abdominal wall hernia or abnormality. No abdominopelvic ascites. Musculoskeletal: No acute or significant osseous findings. IMPRESSION: 1.  No evidence for urinary tract calculus or obstruction. 2. Likely calcified uterine fibroid on the left. This can be further evaluated with pelvic ultrasound as clinically warranted. 3.  Aortic Atherosclerosis (ICD10-I70.0). Electronically Signed   By: Darliss Cheney M.D.   On: 03/21/2021 19:14    Procedures Procedures   Medications Ordered in ED Medications - No data to display  ED Course  I have reviewed the triage vital signs and the nursing notes.  Pertinent labs & imaging results that were available during my care of the patient were reviewed by me and considered in my medical decision making (see chart for details).    MDM Rules/Calculators/A&P                           Patient is a 60 year old female who presents with pain in her left lower abdomen.  She currently does not have any pain.  She had labs which are nonconcerning.  Her urinalysis is normal without evidence of infection or blood.  She does have a prior history of kidney stones.  She had a CT scan which showed no acute abnormalities.  There is a possible uterine fibroid on the left.  Patient does say that Dr. Cherly Hensen had commented on some small uterine  fibroids.  She currently does not have any suggestions of ovarian torsion.  She had a recent pelvic exam with the symptoms that was unrevealing per her report.  She does say that she had a pelvic ultrasound done by Dr. Cherly Hensen that was reported to be negative and she does not want to repeat this tonight.  I encouraged her to follow-up with Dr. Cherly Hensen to make sure that they did not feel that the uterine fibroid was potentially a source of her pain.  Otherwise she can follow-up with Dr. Lenord Fellers.  Return precautions were given. Final Clinical Impression(s) / ED Diagnoses Final diagnoses:  LLQ abdominal pain    Rx / DC Orders ED Discharge Orders     None        Rolan Bucco, MD 03/21/21 2011

## 2021-03-21 NOTE — ED Triage Notes (Signed)
Pt arrives to ED with c/o of pelvic pain x3-4 weeks. The pain is intermittent. The pain started to worsen over the past week. The pain is sharp. She denies vaginal bleeding, vaginal discharge. She denies N/V/D. She denies urinary frequency, hematuria, and dysuria. Heating pads and Ibuprofen help with pain.

## 2021-03-21 NOTE — ED Notes (Signed)
ED Provider at bedside. 

## 2021-03-21 NOTE — Telephone Encounter (Signed)
Carrie Mosley [phone]  Cythina called to say she is having pelvic pain, no discharge or bleeding for 3 weeks getting worse and is constance

## 2021-03-21 NOTE — Telephone Encounter (Signed)
Dr Lenord Fellers advised patient to go to ER.

## 2021-03-21 NOTE — Telephone Encounter (Signed)
Patient called about lower abdominal pain. Saw Dr. Cherly Hensen, GYN recently.I do not have a copy ot that visit. Apparently, Dr. Cherly Hensen ordered  ultrasound that was normal. That report is not in Epic. Has pain in lower left pelvis. No pain with urination. Pain is intermittent. She has continued regular activities but is concerned.   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 She has a remote history of kidney stone over 20 years ago. She is concerned about kidney stones but has no fever, chills, nausea,or vomiting. No back pain or flank pain.No UTI symptoms. No hematuria.  We do not have appt availability today. The best option is [facility] today to rule out kidney stone or abdominal pathology.  Had colonoscopy by Dr. Loreta Ave in 2015 which was normal with 10 year follow up recommended.  MJB, MD

## 2021-03-22 NOTE — Telephone Encounter (Signed)
Carrie Mosley called to make sure you had seen the report from where she went to the ED. She stated that she was not in any pain today. She had seen or spoke with Dr Cherly Hensen and she does not think it is uterine fibroid.

## 2021-04-12 ENCOUNTER — Telehealth: Payer: Self-pay

## 2021-04-12 NOTE — Telephone Encounter (Signed)
Patient asking for appt to discuss lower pelvis pain. She has seen they GYN who said she did have fibroids but they were so small that they shouldn't be causing this problem. This pain comes and goes. Please advise.

## 2021-04-13 NOTE — Telephone Encounter (Signed)
scheduled

## 2021-05-05 ENCOUNTER — Encounter: Payer: Self-pay | Admitting: Internal Medicine

## 2021-05-05 ENCOUNTER — Ambulatory Visit: Payer: BC Managed Care – PPO | Admitting: Internal Medicine

## 2021-05-05 ENCOUNTER — Other Ambulatory Visit: Payer: Self-pay

## 2021-05-05 VITALS — BP 112/64 | HR 85 | Resp 18 | Wt 142.0 lb

## 2021-05-05 DIAGNOSIS — R102 Pelvic and perineal pain: Secondary | ICD-10-CM | POA: Diagnosis not present

## 2021-05-05 DIAGNOSIS — Z23 Encounter for immunization: Secondary | ICD-10-CM | POA: Diagnosis not present

## 2021-05-05 MED ORDER — HYOSCYAMINE SULFATE SL 0.125 MG SL SUBL: 1.0000 | SUBLINGUAL_TABLET | Freq: Three times a day (TID) | SUBLINGUAL | 99 refills | Status: DC | PRN

## 2021-05-05 MED ORDER — FLUCONAZOLE 150 MG PO TABS: 150.0000 mg | ORAL_TABLET | Freq: Once | ORAL | 0 refills | Status: AC

## 2021-05-05 MED ORDER — ROSUVASTATIN CALCIUM 5 MG PO TABS: 5.0000 mg | ORAL_TABLET | Freq: Every day | ORAL | 3 refills | Status: DC

## 2021-05-05 MED ORDER — AZITHROMYCIN 250 MG PO TABS: ORAL_TABLET | ORAL | 0 refills | Status: AC

## 2021-05-18 NOTE — Patient Instructions (Signed)
We are sorry to hear about your recent loss of your aunt.  Have provided a Zithromax Z-PAK and Diflucan to have with you when traveling by airplane.  I am glad to hear that abdominal issues have resolved.  Return as needed.  Extensive evaluation by Dr. Fredderick Phenix showed no concerns.

## 2021-05-18 NOTE — Progress Notes (Signed)
° °  Subjective:    Patient ID: Carrie Mosley, female    DOB: 08/24/1960, 60 y.o.   MRN: [id number]  HPI 60 year old Female here to discuss recent episode of pelvic pain that was prolonged.  She was referred to the emergency department in mid October for this issue because patient was leaving town for work and we could not get her seen in [place] done prior to her departure.  Apparently had been having left lower abdominal pain for 3 or 4 weeks.  Had no change in urine symptoms or stools.  No fever.  Had no vaginal bleeding or discharge.  Is not sexually active.  Dr. Garwin Brothers had seen her for Pap smear October 3 and at that time she had a pelvic ultrasound that Dr. Garwin Brothers felt was normal.  Patient has history of benign ethnic neutropenia and white blood cell count was 3700.  Hemoglobin 14.9 g and platelet count 192,000.  C-Met was normal.  Urine specimen was negative for blood or pyuria.  No gallstones or splenomegaly noted.  Lower chest had no acute abnormality.  Kidneys were normal adrenal glands were normal.  No abnormal bowel thickening or evidence of appendicitis.  There was a question of a calcified left uterine fibroid measuring 15 mm.  No evidence of urinary calculus.  The pain has improved over the past few weeks.  She is returning to work later this week but will be driving out of state to a funeral for close relative.      Review of Systems no new complaints except grief reaction     Objective:   Physical Exam Vital signs reviewed above  Abdomen no hepatosplenomegaly masses or tenderness today       Assessment & Plan:   Abdominal pain is resolved  Travel advice  Grief reaction  Plan: Return as needed.  Patient reassured.  Sympathy extended to her in loss of relative.  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  I did provide her with Zithromax Z-Pak and Diflucan 150 mg tablet x1 in case she becomes ill on one of her trips.

## 2021-06-02 ENCOUNTER — Other Ambulatory Visit: Payer: Self-pay

## 2021-06-02 ENCOUNTER — Ambulatory Visit
Admission: RE | Admit: 2021-06-02 | Discharge: 2021-06-02 | Disposition: A | Discharge status: Home / Self Care | Payer: BC Managed Care – PPO | Source: Ambulatory Visit | Attending: Obstetrics and Gynecology | Admitting: Obstetrics and Gynecology

## 2021-06-02 DIAGNOSIS — Z1231 Encounter for screening mammogram for malignant neoplasm of breast: Secondary | ICD-10-CM

## 2021-06-16 ENCOUNTER — Other Ambulatory Visit: Payer: Self-pay

## 2021-06-16 ENCOUNTER — Other Ambulatory Visit: Payer: BC Managed Care – PPO | Admitting: Internal Medicine

## 2021-06-16 DIAGNOSIS — Z1329 Encounter for screening for other suspected endocrine disorder: Secondary | ICD-10-CM

## 2021-06-16 DIAGNOSIS — Z13220 Encounter for screening for lipoid disorders: Secondary | ICD-10-CM

## 2021-06-16 DIAGNOSIS — Z0000 Encounter for general adult medical examination without abnormal findings: Secondary | ICD-10-CM

## 2021-06-16 DIAGNOSIS — Z8639 Personal history of other endocrine, nutritional and metabolic disease: Secondary | ICD-10-CM

## 2021-06-16 LAB — COMPLETE METABOLIC PANEL WITH GFR
AG Ratio: 1.4 (calc) (ref 1.0–2.5)
ALT: 11 U/L (ref 6–29)
AST: 22 U/L (ref 10–35)
Albumin: 4.3 g/dL (ref 3.6–5.1)
Alkaline phosphatase (APISO): 105 U/L (ref 37–153)
BUN: 12 mg/dL (ref 7–25)
CO2: 29 mmol/L (ref 20–32)
Calcium: 10 mg/dL (ref 8.6–10.4)
Chloride: 103 mmol/L (ref 98–110)
Creat: 0.8 mg/dL (ref 0.50–1.03)
Globulin: 3 g/dL (calc) (ref 1.9–3.7)
Glucose, Bld: 79 mg/dL (ref 65–99)
Potassium: 4.2 mmol/L (ref 3.5–5.3)
Sodium: 140 mmol/L (ref 135–146)
Total Bilirubin: 0.8 mg/dL (ref 0.2–1.2)
Total Protein: 7.3 g/dL (ref 6.1–8.1)
eGFR: 85 mL/min/{1.73_m2} (ref >=60)

## 2021-06-16 LAB — CBC WITH DIFFERENTIAL/PLATELET
Absolute Monocytes: 219 cells/uL (ref 200–950)
Basophils Absolute: 21 cells/uL (ref 0–200)
Basophils Relative: 0.7 %
Eosinophils Absolute: 99 cells/uL (ref 15–500)
Eosinophils Relative: 3.3 %
HCT: 43.1 % (ref 35.0–45.0)
Hemoglobin: 14.5 g/dL (ref 11.7–15.5)
Lymphs Abs: 1083 cells/uL (ref 850–3900)
MCH: 28.2 pg (ref 27.0–33.0)
MCHC: 33.6 g/dL (ref 32.0–36.0)
MCV: 83.7 fL (ref 80.0–100.0)
MPV: 11.6 fL (ref 7.5–12.5)
Monocytes Relative: 7.3 %
Neutro Abs: 1578 cells/uL (ref 1500–7800)
Neutrophils Relative %: 52.6 %
Platelets: 195 10*3/uL (ref 140–400)
RBC: 5.15 10*6/uL — ABNORMAL HIGH (ref 3.80–5.10)
RDW: 12.8 % (ref 11.0–15.0)
Total Lymphocyte: 36.1 %
WBC: 3 10*3/uL — ABNORMAL LOW (ref 3.8–10.8)

## 2021-06-16 LAB — VITAMIN D 25 HYDROXY (VIT D DEFICIENCY, FRACTURES): Vit D, 25-Hydroxy: 51 ng/mL (ref 30–100)

## 2021-06-16 LAB — LIPID PANEL
Cholesterol: 184 mg/dL (ref <200)
HDL: 81 mg/dL (ref >=50)
LDL Cholesterol (Calc): 88 mg/dL (calc)
Non-HDL Cholesterol (Calc): 103 mg/dL (calc) (ref <130)
Total CHOL/HDL Ratio: 2.3 (calc) (ref <5.0)
Triglycerides: 60 mg/dL (ref <150)

## 2021-06-16 LAB — TSH: TSH: 1.06 mIU/L (ref 0.40–4.50)

## 2021-06-17 ENCOUNTER — Encounter: Payer: Self-pay | Admitting: Internal Medicine

## 2021-06-17 ENCOUNTER — Telehealth: Payer: Self-pay

## 2021-06-17 ENCOUNTER — Ambulatory Visit (INDEPENDENT_AMBULATORY_CARE_PROVIDER_SITE_OTHER): Payer: BC Managed Care – PPO | Admitting: Internal Medicine

## 2021-06-17 VITALS — BP 106/64 | HR 86 | Temp 98.0°F | Ht 63.5 in | Wt 150.0 lb

## 2021-06-17 DIAGNOSIS — Z23 Encounter for immunization: Secondary | ICD-10-CM | POA: Diagnosis not present

## 2021-06-17 DIAGNOSIS — Z0000 Encounter for general adult medical examination without abnormal findings: Secondary | ICD-10-CM | POA: Diagnosis not present

## 2021-06-17 LAB — POCT URINALYSIS DIPSTICK
Bilirubin, UA: NEGATIVE
Blood, UA: NEGATIVE
Glucose, UA: NEGATIVE
Ketones, UA: NEGATIVE
Leukocytes, UA: NEGATIVE
Nitrite, UA: NEGATIVE
Protein, UA: NEGATIVE
Spec Grav, UA: 1.015 (ref 1.010–1.025)
Urobilinogen, UA: 0.2 E.U./dL
pH, UA: 7.5 (ref 5.0–8.0)

## 2021-06-17 NOTE — Patient Instructions (Addendum)
It was a pleasure to see you today.  Tetanus booster given.  Return in 1 year or as needed.  Continue diet and exercise regimen.

## 2021-06-17 NOTE — Progress Notes (Signed)
° °  Subjective:    Patient ID: Carrie Mosley, female    DOB: 1960-12-18, 61 y.o.   MRN: [id number]  HPI 61 year old Female for health maintenance exam and evaluation of medical issues.  Recently she had some issues with abdominal pain.  She was seen by Dr. Tamera Punt in the Emergency department who did an excellent work-up and found no etiology on CT of abdomen and pelvis to explain her pain.  GYN had told her she had fibroid but I do not think that was causing her pain.  Was found to have a calcified uterine fibroid on the left.  Her pain has subsequently resolved.  She feels well now and has no new complaints.  Became menopausal in 2011.  Dr. Garwin Brothers is her GYN physician.  History of vitamin D deficiency.  Likely has lactose intolerance.  History of recurrent bacterial vaginosis but no episodes in some time.  History of occasional migraine headaches but these have lessened as she has gotten older.  History of kidney stone in 1999.  History of benign cardiac murmur.  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  She saw Dr. Raelyn Mora very in April 2012 when he felt she had eustachian tube dysfunction.  She was allergy tested in 2003 and there was no hypersensitivity to a screening panel of allergens.  At that time CT of the sinuses was obtained showing no sinus disease.  Social history: Single, never married.  No children.  Rarely drinks alcohol.  Does not smoke.  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  Family history: Mother died of complications of diabetes.  Mother died apparently of septicemia.  No sisters.  Father died of heart failure in his 61s.  He had diabetes and coronary artery disease.    Review of Systems  Constitutional: Negative.    Respiratory: Negative.    Cardiovascular: Negative.   Gastrointestinal: Negative.   Genitourinary: Negative.   Neurological: Negative.   Psychiatric/Behavioral: Negative.        Objective:   Physical Exam Blood pressure 106/64 pulse 86 temperature 98 degrees pulse oximetry 98% weight 150 pounds BMI 26.15  Skin: Warm and dry.  No cervical adenopathy.  No thyromegaly.  No carotid bruits.  Chest is clear to auscultation without rales or wheezing.  Cardiac exam: Regular rate and rhythm without murmurs or ectopy.  Abdomen is soft nondistended without hepatosplenomegaly masses or tenderness.  No lower extremity pitting edema.  GYN exam deferred to Dr. Garwin Brothers.  Neurological exam is intact without gross focal deficits.  Affect thought and judgment are normal.       Assessment & Plan:  Labs are reviewed including vitamin D level, dipstick UA, CBC, c-Met lipid panel and TSH and are all stable and within normal limits.  She had colonoscopy in 2015.  Immunizations reviewed.  She has had 1 Shingrix vaccine and is due for another 1.  Has had 5 COVID vaccines.  Given tetanus immunization update today.  Had flu vaccine May 05, 2021.  Plan: Return in 1 year or as needed.  Labs are stable.  Mammogram was done June 02, 2021 and was normal.

## 2021-06-17 NOTE — Telephone Encounter (Signed)
error 

## 2021-09-05 ENCOUNTER — Ambulatory Visit
Admission: RE | Admit: 2021-09-05 | Discharge: 2021-09-05 | Disposition: A | Discharge status: Home / Self Care | Payer: BC Managed Care – PPO | Source: Ambulatory Visit | Attending: Obstetrics and Gynecology | Admitting: Obstetrics and Gynecology

## 2021-09-05 DIAGNOSIS — Z780 Asymptomatic menopausal state: Secondary | ICD-10-CM

## 2021-11-08 ENCOUNTER — Telehealth: Payer: Self-pay

## 2021-11-08 NOTE — Telephone Encounter (Signed)
Schedule tomorrow.

## 2021-11-08 NOTE — Telephone Encounter (Signed)
I am unable to call her at this time she will have to be called tomorrow and asked.

## 2021-11-08 NOTE — Telephone Encounter (Signed)
Patient is having bilateral shoulder pain and now has a knot on one of them. She would ike to be seen. She is flying back to town tonight. Please advise

## 2021-11-09 ENCOUNTER — Ambulatory Visit: Payer: BC Managed Care – PPO | Admitting: Internal Medicine

## 2021-11-09 ENCOUNTER — Encounter: Payer: Self-pay | Admitting: Internal Medicine

## 2021-11-09 VITALS — BP 128/80 | HR 68 | Temp 97.2°F | Ht 63.5 in | Wt 152.5 lb

## 2021-11-09 DIAGNOSIS — Z8639 Personal history of other endocrine, nutritional and metabolic disease: Secondary | ICD-10-CM

## 2021-11-09 DIAGNOSIS — M549 Dorsalgia, unspecified: Secondary | ICD-10-CM

## 2021-11-09 MED ORDER — MELOXICAM 15 MG PO TABS: 15.0000 mg | ORAL_TABLET | Freq: Every day | ORAL | 1 refills | Status: DC

## 2021-11-09 MED ORDER — CYCLOBENZAPRINE HCL 10 MG PO TABS: 10.0000 mg | ORAL_TABLET | Freq: Three times a day (TID) | ORAL | 0 refills | Status: DC | PRN

## 2021-11-09 NOTE — Progress Notes (Signed)
   Subjective:    Patient ID: Carrie Mosley, female    DOB: Apr 28, 1961, 61 y.o.   MRN: [id number]  HPI 61 year old Female seen with musculoskeletal pain. 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 Recently carried some heavy bags on both shoulders some distance and developed pain in shoulders and scapula areas. This has not resolved after several days and she is concerned. Has decreased ROM in neck due to soreness. Has note a bony prominence in left anterior should er that is sore and she thought was a new finding as well.  She is also planning a safari trip to [place] next year. Would be advisable to have consult with Travel medicine clinic well in advance of going.Options include [facility] or [facility].  Physically active. Stays in good physical shape. Has osteopenia on recent bone density study, T score -2.3. Does not want to be on bisphosphonates. Needs Vitamin D level checked. Take OTC Vitamin D.  Review of Systems Travels to [place] for work as that is the airport where is is based. Leaves several hours in advance of flight departure.     Objective:   Physical Exam  VS reviewed.  Has palpable musculoskeletal pain across her shoulders and in scapular areas bilaterally.        Assessment & Plan:  Musculoskeletal pain both shoulders/scapular areas due to heavy lifting  History of vitamin D deficiency-patient prefers high-dose vitamin D weekly to daily vitamin D.  This will need to be prescribed once again.  Plan: Mobic 15 mg daily to take with a meal for musculoskeletal inflammation.  May take Flexeril 10 mg up to 3 times daily as needed for pain and spasm in this area as well.  Consider physical therapy if not improving.  For history of vitamin D deficiency, prescribing 50,000 units weekly.

## 2021-11-14 ENCOUNTER — Telehealth: Payer: Self-pay | Admitting: Internal Medicine

## 2021-11-14 NOTE — Telephone Encounter (Signed)
Carrie Mosley [phone]  Derica called to say she forgot to get her labs when she was here, that she would like to come in tomorrow to get. She was saying it was a Vit D. I just wanted to verify that.

## 2021-11-15 ENCOUNTER — Other Ambulatory Visit: Payer: Self-pay | Admitting: Internal Medicine

## 2021-11-15 ENCOUNTER — Other Ambulatory Visit: Payer: BC Managed Care – PPO

## 2021-11-15 DIAGNOSIS — Z8639 Personal history of other endocrine, nutritional and metabolic disease: Secondary | ICD-10-CM

## 2021-11-15 NOTE — Addendum Note (Signed)
Addended by: Elwin Mocha on: 11/15/2021 11:53 AM   Modules accepted: Orders

## 2021-11-15 NOTE — Addendum Note (Signed)
Addended by: Elwin Mocha on: 11/15/2021 12:44 PM   Modules accepted: Orders

## 2021-11-15 NOTE — Addendum Note (Signed)
Addended by: Anderson Malta on: 11/15/2021 11:57 AM   Modules accepted: Orders

## 2021-11-17 LAB — VITAMIN D 25 HYDROXY (VIT D DEFICIENCY, FRACTURES): Vit D, 25-Hydroxy: 35 ng/mL (ref 30–100)

## 2021-11-17 LAB — HOUSE ACCOUNT TRACKING

## 2021-11-21 ENCOUNTER — Telehealth: Payer: Self-pay | Admitting: Internal Medicine

## 2021-11-21 NOTE — Telephone Encounter (Signed)
Received PT orders to sign and faxed back.  Shoulder pain onset 08/27/2021 PT start date 6/13/203  Signed orders and faxed back 11/17/2021

## 2021-11-21 NOTE — Telephone Encounter (Signed)
This message was sent via FAXCOM, a product from [employer]. http://www.biscom.com/                    -------Fax Transmission Report-------  To:               Recipient at [phone] Subject:          FW: Hp Scans Result:           The transmission was successful. Explanation:      All Pages Ok Pages Sent:       4 Connect Time:     2 minutes, 6 seconds Transmit Time:    11/21/2021 09:14 Transfer Rate:    14400 Status Code:      0000 Retry Count:      0 Job Id:           [id number] Unique Id:        [id number] Fax Line:         7 Fax Server:       [employer]

## 2021-11-30 ENCOUNTER — Encounter: Payer: Self-pay | Admitting: Internal Medicine

## 2021-11-30 ENCOUNTER — Telehealth: Payer: Self-pay | Admitting: Internal Medicine

## 2021-11-30 MED ORDER — ERGOCALCIFEROL 1.25 MG (50000 UT) PO CAPS: 50000.0000 [IU] | ORAL_CAPSULE | ORAL | 3 refills | Status: DC

## 2021-11-30 NOTE — Telephone Encounter (Signed)
Carrie Mosley called to say she had been waiting to hear back from Nurse to find out if you wanted her to take the high dose of VIT D. I look back in notes and see that this what you recommend form the lab work, she does want to take it. Would like to get it today if possible.

## 2021-11-30 NOTE — Telephone Encounter (Signed)
I have personally sent in to her pharmacy today Rx for Drisdol 50,000 units weekly #12 with 3 refills ie Rx is good for one year. MJB, MD

## 2021-11-30 NOTE — Patient Instructions (Signed)
Mobic 15 mg daily to take for musculoskeletal inflammation of the upper back area.  May alternate heat and ice to those areas as well.  Consider physical therapy if not making an improvement within a few days.  Patient prefers high-dose vitamin D supplement weekly.  Will be prescribed 50,000 units weekly.

## 2022-01-23 ENCOUNTER — Telehealth: Payer: Self-pay | Admitting: Internal Medicine

## 2022-01-23 NOTE — Telephone Encounter (Signed)
Carrie Mosley [phone]  Carrie Mosley called to say she had food poisoning earlier this month and she has had diarrhea off and on every since, more on than off and today on the plane she had some sharp pains in her stomach, but after she went to bathroom thy went away. She is off tomorrow and wanted to come in and see you. I scheduled for 4:00

## 2022-01-24 ENCOUNTER — Ambulatory Visit: Payer: BC Managed Care – PPO | Admitting: Internal Medicine

## 2022-01-24 VITALS — BP 106/60

## 2022-01-24 DIAGNOSIS — K580 Irritable bowel syndrome with diarrhea: Secondary | ICD-10-CM

## 2022-01-24 MED ORDER — DICYCLOMINE HCL 20 MG PO TABS
ORAL_TABLET | ORAL | 1 refills | Status: DC
Start: 2022-01-24 — End: 2022-12-19

## 2022-01-24 NOTE — Progress Notes (Signed)
   Subjective:    Patient ID: Carrie Mosley, female    DOB: Oct 12, 1960, 61 y.o.   MRN: [id number]  HPI 61 year old Female seen for intermittent bouts of diarrhea.  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  This has been inconvenient when she has an acute episode of diarrhea and has to rush to the bathroom.  There is no blood in the stool.  The stool is not black or tarry.  Work is stressful.  Passengers can be demanding.  Patient had colonoscopy in 2015 which was normal and follow-up was recommended in 10 years.  Her general health is excellent.  She does have a history of irritable bowel syndrome and possible lactose intolerance for several years.  In 2015 she had nausea and abdominal cramping after eating dairy products.  Was treated for possible foodborne infection with Cipro for 3 days but was suspected of having lactose intolerance.  History of occasional migraine headaches.  Had kidney stone in 1989.  Had allergy testing in 2003 and there was no hypersensitivity to a screening panel of allergens.  She has a history of vitamin D deficiency and takes high-dose vitamin D weekly.  She had a CT of her abdomen and pelvis in October 2022 in the emergency department for abdominal pain.  She was found to have a calcified uterine fibroid on the left but I do not think that was causing her pain.  Her pain resolved within a short period of time.  Review of Systems see above     Objective:   Physical Exam   BP 106/60  Skin is warm and dry.  Chest clear.  Cardiac exam regular rate and rhythm.  Abdomen no hepatosplenomegaly masses or tenderness.     Assessment & Plan:  This question regarding irritable bowel syndrome.  It is very common and can be aggravated by foods and stress as well as dairy products.  Patient has a history of probable lactose intolerance.  Sometimes a fiber supplement taken daily works well.  Patient given handout on irritable bowel syndrome explaining this  condition and making recommendations for food choices.  I have prescribed Bentyl 20 mg 1 tab 1/2-hour before meals as needed.  She will let me know if this is not working.

## 2022-02-22 ENCOUNTER — Encounter: Payer: Self-pay | Admitting: Internal Medicine

## 2022-02-22 NOTE — Patient Instructions (Signed)
Patient was given handout on irritable bowel syndrome describing the disorder and suggested recommendations.  She will try Bentyl 20 mg 1/2-hour before meals particularly with work where it seems to be more of an issue.  I do think she may have lactose intolerance as well.  Needs to be careful with dairy products.  Return as needed.

## 2022-04-25 ENCOUNTER — Other Ambulatory Visit: Payer: Self-pay | Admitting: Obstetrics and Gynecology

## 2022-04-25 DIAGNOSIS — Z1231 Encounter for screening mammogram for malignant neoplasm of breast: Secondary | ICD-10-CM

## 2022-06-19 ENCOUNTER — Other Ambulatory Visit: Payer: BC Managed Care – PPO

## 2022-06-19 DIAGNOSIS — Z136 Encounter for screening for cardiovascular disorders: Secondary | ICD-10-CM

## 2022-06-19 DIAGNOSIS — R5383 Other fatigue: Secondary | ICD-10-CM

## 2022-06-19 DIAGNOSIS — Z13220 Encounter for screening for lipoid disorders: Secondary | ICD-10-CM

## 2022-06-20 ENCOUNTER — Encounter: Payer: Self-pay | Admitting: Internal Medicine

## 2022-06-20 ENCOUNTER — Ambulatory Visit (INDEPENDENT_AMBULATORY_CARE_PROVIDER_SITE_OTHER): Payer: BC Managed Care – PPO | Admitting: Internal Medicine

## 2022-06-20 ENCOUNTER — Telehealth: Payer: Self-pay | Admitting: Internal Medicine

## 2022-06-20 VITALS — BP 110/70 | HR 78 | Temp 98.7°F | Ht 63.5 in | Wt 159.0 lb

## 2022-06-20 DIAGNOSIS — Z8639 Personal history of other endocrine, nutritional and metabolic disease: Secondary | ICD-10-CM | POA: Diagnosis not present

## 2022-06-20 DIAGNOSIS — K580 Irritable bowel syndrome with diarrhea: Secondary | ICD-10-CM | POA: Diagnosis not present

## 2022-06-20 DIAGNOSIS — Z0000 Encounter for general adult medical examination without abnormal findings: Secondary | ICD-10-CM | POA: Diagnosis not present

## 2022-06-20 DIAGNOSIS — Z23 Encounter for immunization: Secondary | ICD-10-CM | POA: Diagnosis not present

## 2022-06-20 DIAGNOSIS — M898X1 Other specified disorders of bone, shoulder: Secondary | ICD-10-CM | POA: Diagnosis not present

## 2022-06-20 DIAGNOSIS — E7800 Pure hypercholesterolemia, unspecified: Secondary | ICD-10-CM | POA: Diagnosis not present

## 2022-06-20 LAB — CBC WITH DIFFERENTIAL/PLATELET
Absolute Monocytes: 190 cells/uL — ABNORMAL LOW (ref 200–950)
Basophils Absolute: 20 cells/uL (ref 0–200)
Basophils Relative: 0.7 %
Eosinophils Absolute: 160 cells/uL (ref 15–500)
Eosinophils Relative: 5.7 %
HCT: 45 % (ref 35.0–45.0)
Hemoglobin: 15 g/dL (ref 11.7–15.5)
Lymphs Abs: 1075 cells/uL (ref 850–3900)
MCH: 27.9 pg (ref 27.0–33.0)
MCHC: 33.3 g/dL (ref 32.0–36.0)
MCV: 83.8 fL (ref 80.0–100.0)
MPV: 12.2 fL (ref 7.5–12.5)
Monocytes Relative: 6.8 %
Neutro Abs: 1355 cells/uL — ABNORMAL LOW (ref 1500–7800)
Neutrophils Relative %: 48.4 %
Platelets: 193 10*3/uL (ref 140–400)
RBC: 5.37 10*6/uL — ABNORMAL HIGH (ref 3.80–5.10)
RDW: 12.8 % (ref 11.0–15.0)
Total Lymphocyte: 38.4 %
WBC: 2.8 10*3/uL — ABNORMAL LOW (ref 3.8–10.8)

## 2022-06-20 LAB — POCT URINALYSIS DIPSTICK
Bilirubin, UA: NEGATIVE
Blood, UA: NEGATIVE
Glucose, UA: NEGATIVE
Ketones, UA: NEGATIVE
Leukocytes, UA: NEGATIVE
Nitrite, UA: NEGATIVE
Protein, UA: NEGATIVE
Spec Grav, UA: 1.01 (ref 1.010–1.025)
Urobilinogen, UA: 0.2 E.U./dL
pH, UA: 6.5 (ref 5.0–8.0)

## 2022-06-20 LAB — COMPLETE METABOLIC PANEL WITH GFR
AG Ratio: 1.4 (calc) (ref 1.0–2.5)
ALT: 9 U/L (ref 6–29)
AST: 20 U/L (ref 10–35)
Albumin: 4.3 g/dL (ref 3.6–5.1)
Alkaline phosphatase (APISO): 112 U/L (ref 37–153)
BUN: 10 mg/dL (ref 7–25)
CO2: 31 mmol/L (ref 20–32)
Calcium: 9.7 mg/dL (ref 8.6–10.4)
Chloride: 101 mmol/L (ref 98–110)
Creat: 0.93 mg/dL (ref 0.50–1.03)
Globulin: 3.1 g/dL (calc) (ref 1.9–3.7)
Glucose, Bld: 79 mg/dL (ref 65–99)
Potassium: 4.4 mmol/L (ref 3.5–5.3)
Sodium: 139 mmol/L (ref 135–146)
Total Bilirubin: 0.6 mg/dL (ref 0.2–1.2)
Total Protein: 7.4 g/dL (ref 6.1–8.1)
eGFR: 71 mL/min/{1.73_m2} (ref >=60)

## 2022-06-20 LAB — LIPID PANEL
Cholesterol: 213 mg/dL — ABNORMAL HIGH (ref <200)
HDL: 69 mg/dL (ref >=50)
LDL Cholesterol (Calc): 118 mg/dL (calc) — ABNORMAL HIGH
Non-HDL Cholesterol (Calc): 144 mg/dL (calc) — ABNORMAL HIGH (ref <130)
Total CHOL/HDL Ratio: 3.1 (calc) (ref <5.0)
Triglycerides: 146 mg/dL (ref <150)

## 2022-06-20 LAB — TSH: TSH: 2.67 mIU/L (ref 0.40–4.50)

## 2022-06-20 NOTE — Patient Instructions (Addendum)
May want to see Sports medicine physician, Dr.  Karlton Lemon for left clavicle pain. Referral to Dr. Juanita Craver for IBS symptoms. Colonoscopy due 2025.  Need RSV, Pneumococcal 20 vaccine.  Getting flu vaccine today.  Dr. Barbaraann Barthel can suggest a PT for right clavicle pain.  Consider coronary calcium scoring.

## 2022-06-20 NOTE — Telephone Encounter (Signed)
Scheduled patient for 6 month

## 2022-06-20 NOTE — Progress Notes (Signed)
Subjective:    Patient ID: Carrie Mosley, female    DOB: [birth year], 62 y.o.   MRN: [id number]  HPI  62 year old Female seen for health maintenance exam and evaluation of medical issues.  She has a history of irritable bowel syndrome. Last had colonoscopy in 2015.  Dr. Collene Mares is her Gastroenterologist.  She still has some GI concerns and I think it would be best for her to go back and see Dr. Collene Mares for evaluation.  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  She is currently on dicyclomine and this seems to help.  She takes high-dose vitamin D weekly.  Labs are within normal limits with the exception of LDL of 118 and total cholesterol of 213.  HDL is normal at 69 and triglycerides are normal at 146.  C-Met is normal.  She has a history of benign ethnic neutropenia and white blood cell count is 2800.  Highest white count she has had over the past few years was 3700 in October 2022.  TSH is normal.  She had a CT of the abdomen when she was seen in the Emergency Department by Dr. Tamera Punt in 2022.  She also had COVID in May 2022.  Immunizations were discussed today.  Had tetanus immunization update in January 2023.  Had Shingrix second dose May 2023.  Had flu vaccine January 2024.  Discussed with her as well pneumococcal 20 vaccine to be considered and RSV vaccine since she travels a great deal.  Dr. Garwin Brothers is GYN physician.  Patient had mammogram December 2022 due in January 2024 both of which were normal.  She does have some mild osteopenia with T-score in the left femoral neck on bone density April 2023 being -2.3.  She has not wanted to be on bone sparing medication.  She does take vitamin D and we did prescribe high-dose vitamin D weekly for her in June 2023.   Review of Systems Taking metamucil for bowel issues.  Has been having some left clavicle pain.  I think it is probably just musculoskeletal pain but I think sports  medicine physician, Dr. Barbaraann Barthel would be of help with this.  He may be able to prescribe a physical therapist for her.  Suggest she should call Dr. Collene Mares regarding her IBS symptoms and colonoscopy is due in 2025 but may be done sooner if needed.  Vaccines discussed including RSV and pneumococcal 20.  Flu vaccine given today.     Objective:   Physical Exam Blood pressure 110/70, pulse 78, temperature 98.7 pulse oximetry 99% weight 159 pounds BMI 27.72 height 5 feet 3.5 inches Skin: Warm and dry.  No cervical adenopathy.  TMs are clear.  Chest is clear.  Cardiac exam: Regular rate and rhythm.  Abdomen is soft nondistended without hepatosplenomegaly masses or tenderness.  Pelvic exam deferred to GYN physician.  No lower extremity pitting edema.  Brief neurological exam is intact without gross focal deficits.  Affect thought and judgment are normal.      Assessment & Plan:  Left clavicle pain-suggest she see Dr. Barbaraann Barthel for evaluation and possible PT referral  Vitamin D deficiency-continue with high-dose vitamin D weekly  Irritable bowel syndrome has dicyclomine on hand and should be seeing Dr. Collene Mares in the near future to discuss this.  Colonoscopy due 2025.  Vaccines discussed and recommendations made.  Needs RSV and pneumococcal 20 vaccines.  Pure hypercholesterolemia treated with low-dose Crestor 5  mg daily.  Last lipid panel in January 2024 showed LDL to be 118 and previously was 88 in 2023.  Work on diet.  Repeat in 1 year.  Consider coronary calcium scoring.

## 2022-06-20 NOTE — Telephone Encounter (Signed)
Brylee said you wanted to recheck something in a month or 2 but she could not remember what.

## 2022-06-26 ENCOUNTER — Ambulatory Visit
Admission: RE | Admit: 2022-06-26 | Discharge: 2022-06-26 | Disposition: A | Discharge status: Home / Self Care | Payer: BC Managed Care – PPO | Source: Ambulatory Visit | Attending: Obstetrics and Gynecology | Admitting: Obstetrics and Gynecology

## 2022-06-26 DIAGNOSIS — Z1231 Encounter for screening mammogram for malignant neoplasm of breast: Secondary | ICD-10-CM

## 2022-09-23 ENCOUNTER — Encounter: Payer: Self-pay | Admitting: Internal Medicine

## 2022-09-23 ENCOUNTER — Telehealth (INDEPENDENT_AMBULATORY_CARE_PROVIDER_SITE_OTHER): Payer: BC Managed Care – PPO | Admitting: Internal Medicine

## 2022-09-23 DIAGNOSIS — J069 Acute upper respiratory infection, unspecified: Secondary | ICD-10-CM

## 2022-09-23 MED ORDER — BENZONATATE 100 MG PO CAPS: 100.0000 mg | ORAL_CAPSULE | Freq: Three times a day (TID) | ORAL | 0 refills | Status: DC | PRN

## 2022-09-23 MED ORDER — FLUCONAZOLE 150 MG PO TABS: 150.0000 mg | ORAL_TABLET | Freq: Once | ORAL | 0 refills | Status: AC

## 2022-09-23 MED ORDER — AZITHROMYCIN 250 MG PO TABS: ORAL_TABLET | ORAL | 0 refills | Status: AC

## 2022-09-23 MED ORDER — FLUCONAZOLE 150 MG PO TABS: 150.0000 mg | ORAL_TABLET | Freq: Once | ORAL | 0 refills | Status: DC

## 2022-09-23 MED ORDER — HYDROCODONE BIT-HOMATROP MBR 5-1.5 MG/5ML PO SOLN: 5.0000 mL | Freq: Three times a day (TID) | ORAL | 0 refills | Status: DC | PRN

## 2022-09-23 MED ORDER — AZITHROMYCIN 250 MG PO TABS: ORAL_TABLET | ORAL | 0 refills | Status: DC

## 2022-09-23 MED ORDER — BENZONATATE 100 MG PO CAPS: 100.0000 mg | ORAL_CAPSULE | Freq: Two times a day (BID) | ORAL | 0 refills | Status: DC | PRN

## 2022-09-23 NOTE — Telephone Encounter (Signed)
Patient called answering service this morning regarding an acute respiratory infection and call was promptly returned.  She had a slight tickle in her throat yesterday but this morning has developed coughing without sputum production, hoarseness and some upper respiratory congestion.  She has an event tomorrow afternoon where she is speaking and would like to feel better by then.  She has no fever or shaking chills.  No headache.  Main complaint is dry cough and hoarseness.  She is concerned that her voice will not hold out for the event tomorrow.  She would like to get started on antibiotics.  Patient has excellent general health and does not take any chronic medications it set low-dose Crestor and weekly vitamin D.  She has a history of irritable bowel syndrome and takes Bentyl as needed.  She had routine health maintenance exam in the office in January 2024.  She is agreeable to visit in this format.  Patient location: Home  Physician location: Office  After speaking with her for several minutes, I feel that she has an upper respiratory infection.  She is concerned that this will progress to an acute bacterial infection and she has an event tomorrow afternoon that is very important to her.  I am going to send in Zithromax Z-PAK 2 tabs day 1 followed by 1 tab days 2 through 5, Diflucan 150 mg tablet to take if develops Candida vaginitis while on antibiotics, Tessalon Perles 100 mg twice daily as needed for cough and for severe cough. Hycodan 4 ounces 1 teaspoon every 8 hours if needed for severe cough.  Patient advised to rest and stay well-hydrated.  Call if not better in 48 to 72 hours or sooner if worse.  Patient requested prescriptions be sent to [facility] [address] [facility] [place].  Time spent with encounter is 20 min including call, medical decision making and prescribing meds including narcotic cough syrup and checking PMP aware.  MJB, MD

## 2022-09-30 IMAGING — MG DIGITAL SCREENING BILAT W/ TOMO W/ CAD
8 series · 8 of 24 positions shown · non-contrast
Comparison: Previous exam(s).

CLINICAL DATA: Screening.

EXAM:
DIGITAL SCREENING BILATERAL MAMMOGRAM WITH TOMO AND CAD

[L CC synth-2D]
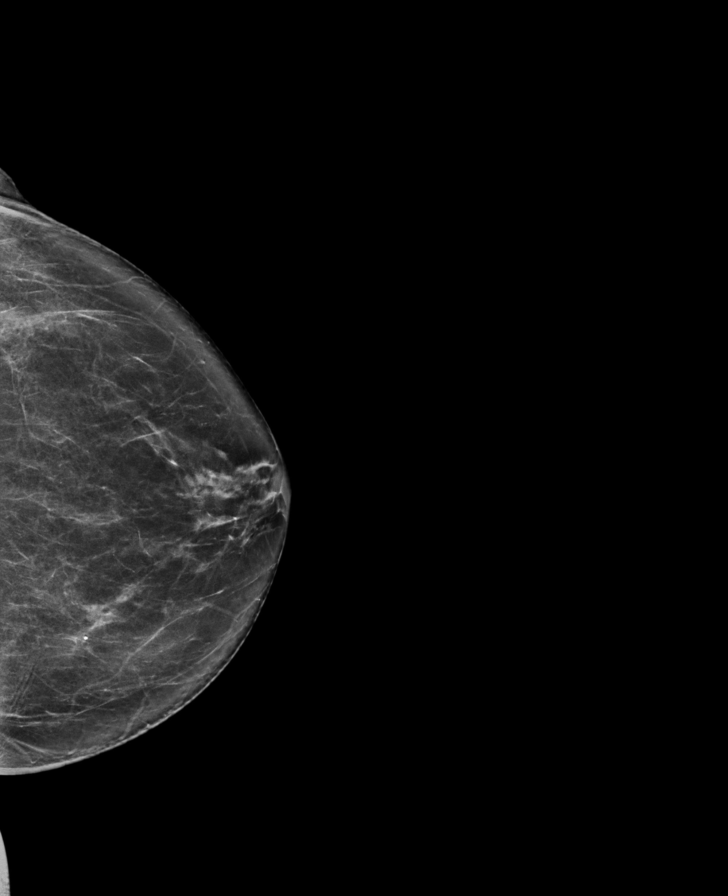

[R CC synth-2D]
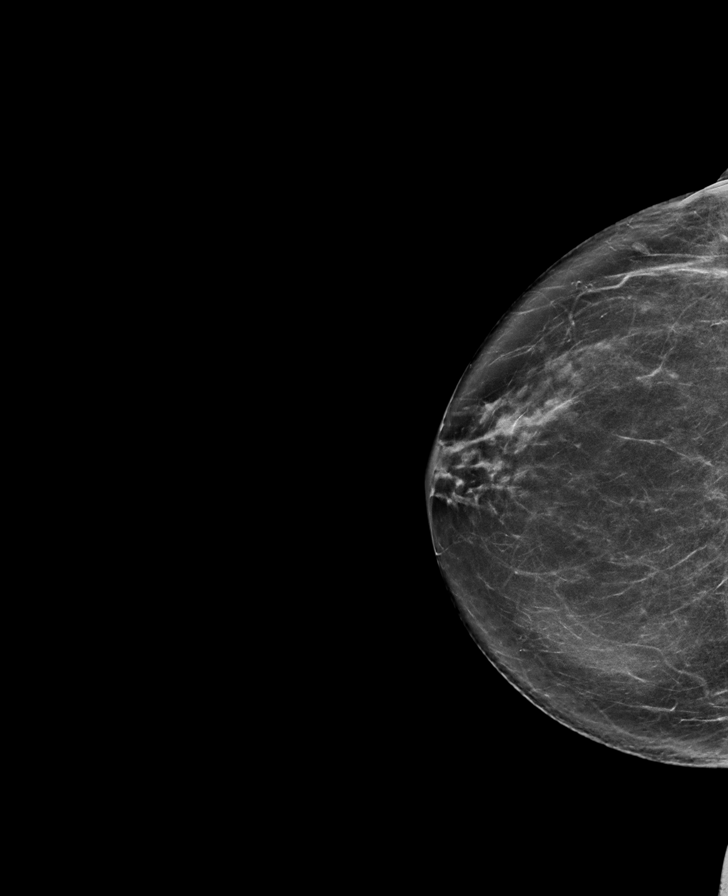

[L MLO synth-2D]
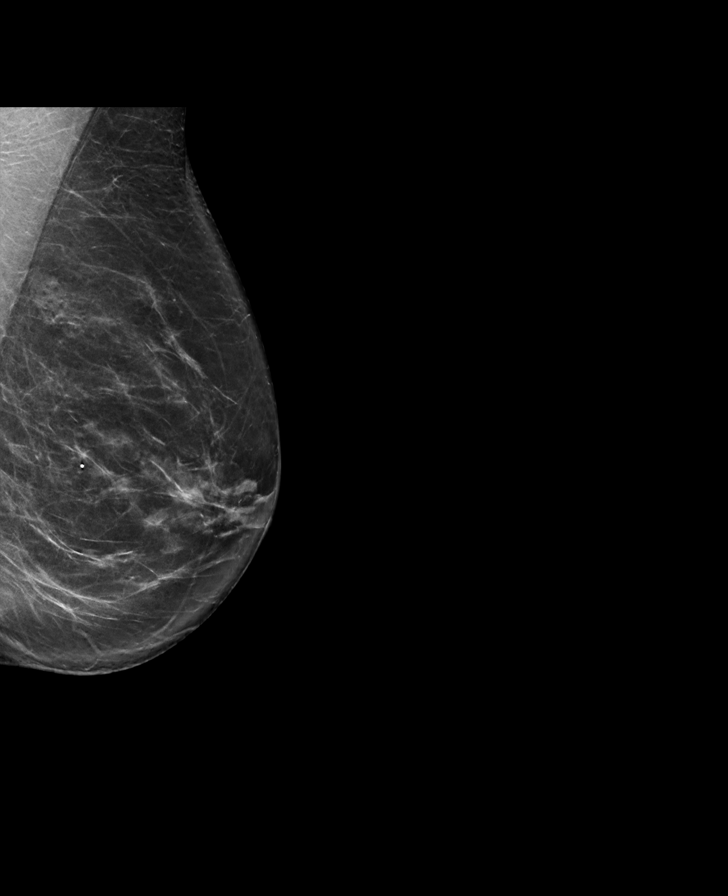

[R MLO synth-2D]
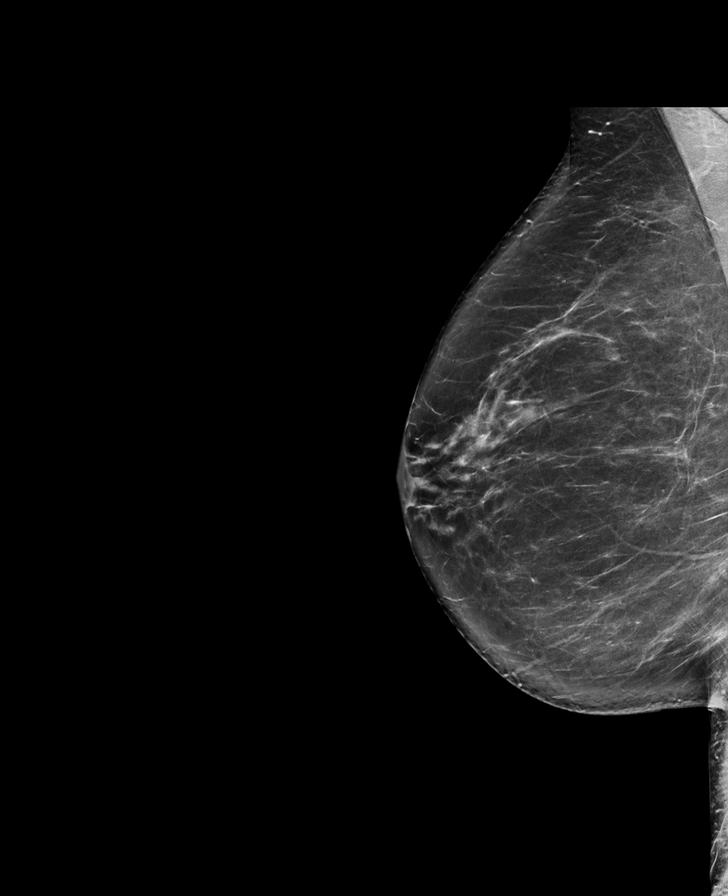

[L MLO tomo · tomo slice 39/76.0]
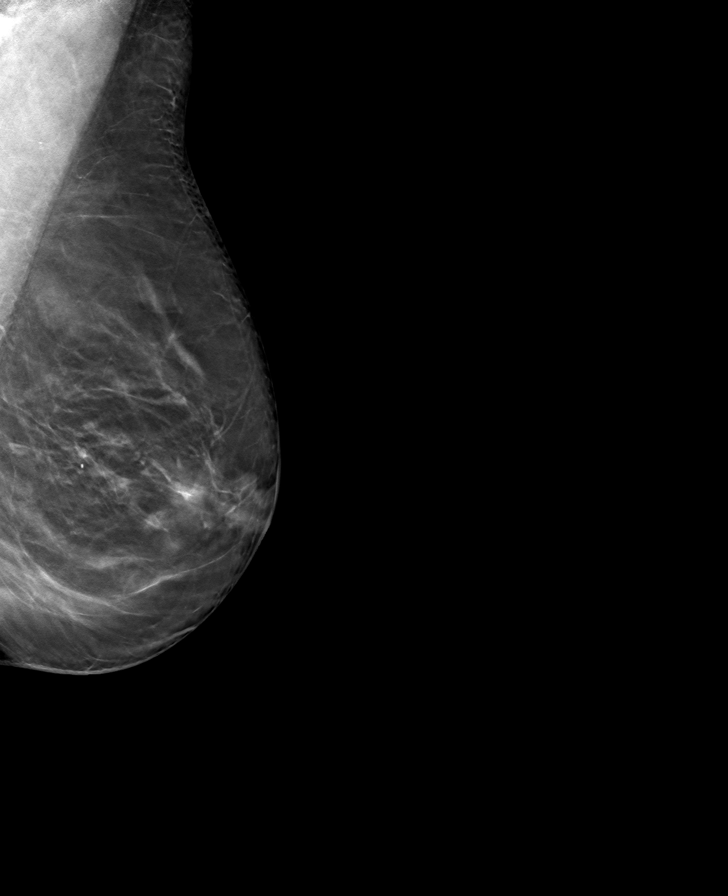

[R MLO tomo · tomo slice 41/81.0]
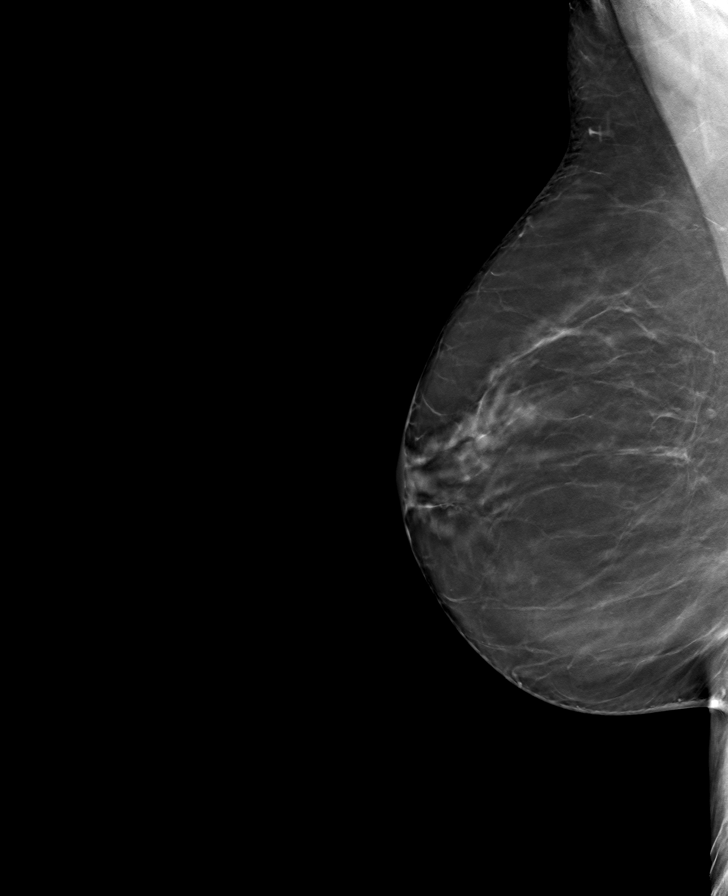

[L CC tomo · tomo slice 37/73.0]
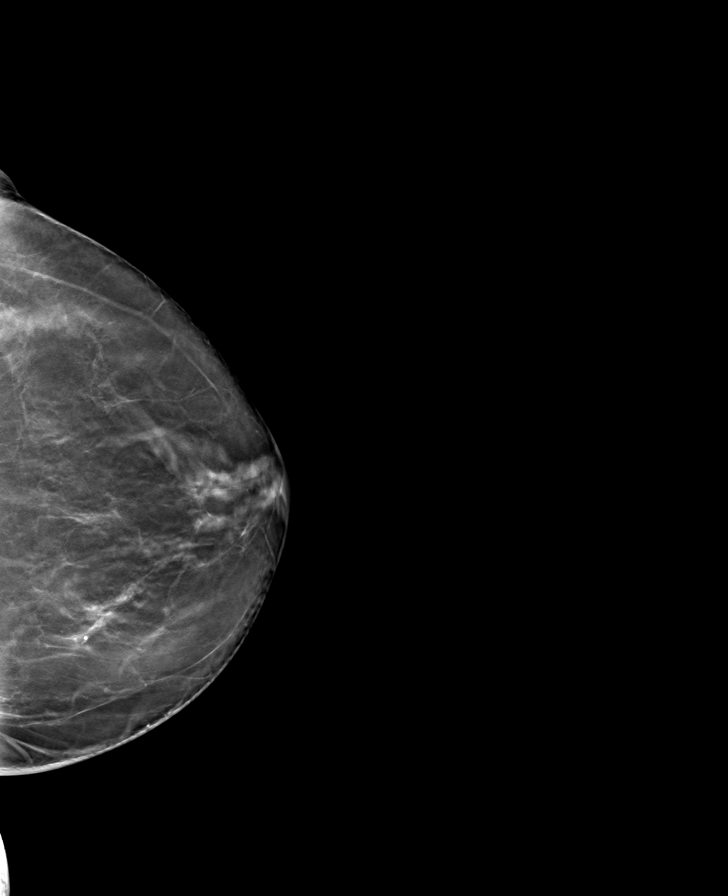

[R CC tomo · tomo slice 39/77.0]
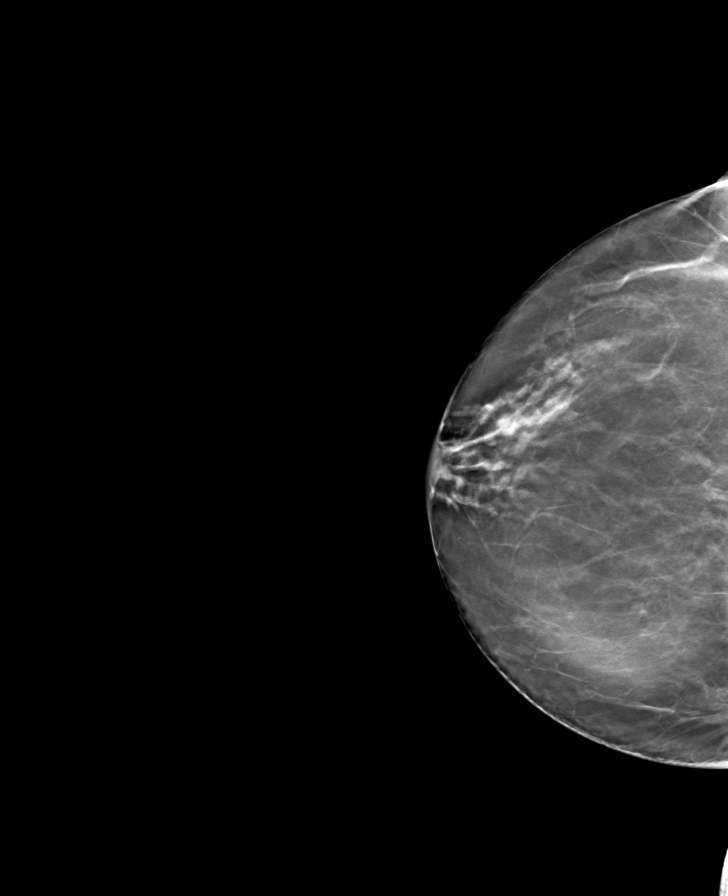

[8 of 24 positions shown; findings below may reference images not displayed]

ACR Breast Density Category b: There are scattered areas of
fibroglandular density.
FINDINGS: There are no findings suspicious for malignancy. Images were
processed with CAD.
IMPRESSION: No mammographic evidence of malignancy. A result letter of this
screening mammogram will be mailed directly to the patient.

RECOMMENDATION:
Screening mammogram in one year. (Code:[id number])

BI-RADS CATEGORY  1: Negative.

## 2022-10-16 ENCOUNTER — Telehealth: Payer: Self-pay | Admitting: Internal Medicine

## 2022-10-16 NOTE — Telephone Encounter (Signed)
Carrie Mosley [phone]  Carrie Mosley called stating her right arm, elbow and shoulder is hurting again, she never did go to physical therapy from last time because is started feeling better. She would like to come in and see you tomorrow morning.

## 2022-10-16 NOTE — Telephone Encounter (Signed)
Called and let her know she was supposed to call DR Hudnall office in January, she said she would call them.

## 2022-11-04 ENCOUNTER — Other Ambulatory Visit: Payer: Self-pay | Admitting: Internal Medicine

## 2022-11-14 ENCOUNTER — Ambulatory Visit: Payer: BC Managed Care – PPO | Admitting: Family Medicine

## 2022-12-12 NOTE — Progress Notes (Addendum)
Patient Care Team: Margaree Mackintosh, MD as PCP - General (Internal Medicine)  Visit Date: 12/12/22  Subjective:    Patient ID: Carrie Mosley , Female   DOB: 01/23/1961, 62 y.o.    MRN: [id number]   62 y.o. Female presents today for a 6 month follow-up.   She has been struggling with more frequent migraines, but no more than one per week. Often she will experience photophobia as a trigger. When she is able to catch it in time, she will take advil for treatment.   Additionally she complains of bilateral shoulder pain, right > left, mostly worse in her anterior and frontal right shoulder. She was recently examined by her sports medicine physician and diagnosed with right rotator cuff impingement with right tennis elbow. She is requesting an x-ray for her right shoulder.  Also she expresses concerns about her weight. She admits to not exercising much lately due to her busy schedule.  She believes she may have allergies. Sometimes after waking up in the mornings she complains of nasal congestion and changes in her voice. May try Flonase nasal spray. Can be referred for allergy testing if symptoms persist.  History of hyperlipidemia treated with rosuvastatin 5 mg daily. Lipid panel normal. Total cholesterol has improved from 213 to 186.   History of Vitamin D deficiency treated with Drisdol 50,000 units weekly. Vitamin D at 96 on 12/18/22, up from 35 on 11/15/21. Continue Drisdol weekly.  History of IBS treated with dicyclomine 20 mg taken one half hour before meals as needed   Past Medical History:  Diagnosis Date   Menopause    Migraine    Varicose veins    Vitamin D deficiency      Family History  Problem Relation Age of Onset   Heart disease Father    Diabetes Father    Diabetes Brother    Breast cancer Maternal Aunt     Social History   Social History Narrative   Not on file      Review of Systems  Constitutional:  Negative for fever and malaise/fatigue.  HENT:   Positive for congestion.   Eyes:  Negative for blurred vision.  Respiratory:  Negative for cough and shortness of breath.   Cardiovascular:  Negative for chest pain, palpitations and leg swelling.  Gastrointestinal:  Negative for vomiting.  Musculoskeletal:  Positive for joint pain (Bilateral shoulders, R>L). Negative for back pain.       (+) Right elbow pain  Skin:  Negative for rash.  Neurological:  Positive for headaches. Negative for loss of consciousness.  Endo/Heme/Allergies:  Positive for environmental allergies.        Objective:   Vitals: LMP 06/05/2010    Physical Exam Vitals and nursing note reviewed.  Constitutional:      General: She is not in acute distress.    Appearance: Normal appearance. She is not toxic-appearing.  HENT:     Head: Normocephalic and atraumatic.  Pulmonary:     Effort: Pulmonary effort is normal.  Musculoskeletal:     Comments: Right tennis elbow.  Skin:    General: Skin is warm and dry.  Neurological:     Mental Status: She is alert and oriented to person, place, and time. Mental status is at baseline.  Psychiatric:        Mood and Affect: Mood normal.        Behavior: Behavior normal.        Thought Content: Thought content normal.  Judgment: Judgment normal.       Results:   Studies obtained and personally reviewed by me:   Labs:       Component Value Date/Time   NA 139 06/19/2022 0940   K 4.4 06/19/2022 0940   CL 101 06/19/2022 0940   CO2 31 06/19/2022 0940   GLUCOSE 79 06/19/2022 0940   BUN 10 06/19/2022 0940   CREATININE 0.93 06/19/2022 0940   CALCIUM 9.7 06/19/2022 0940   PROT 7.4 06/19/2022 0940   ALBUMIN 4.6 03/21/2021 1445   AST 20 06/19/2022 0940   ALT 9 06/19/2022 0940   ALKPHOS 103 03/21/2021 1445   BILITOT 0.6 06/19/2022 0940   GFRNONAA >60 03/21/2021 1445   GFRNONAA 80 04/07/2019 1000   GFRAA 93 04/07/2019 1000     Lab Results  Component Value Date   WBC 2.8 (L) 06/19/2022   HGB 15.0  06/19/2022   HCT 45.0 06/19/2022   MCV 83.8 06/19/2022   PLT 193 06/19/2022    Lab Results  Component Value Date   CHOL 213 (H) 06/19/2022   HDL 69 06/19/2022   LDLCALC 118 (H) 06/19/2022   TRIG 146 06/19/2022   CHOLHDL 3.1 06/19/2022    No results found for: "HGBA1C"   Lab Results  Component Value Date   TSH 2.67 06/19/2022       Assessment & Plan:   Migraines:  She has been struggling with more frequent migraines. Will prescribe Maxalt tabs to take at onset of migraine. Watch for sleep deprivation and for foods or alcohol that could trigger migraines.   Possible Right rotator cuff impingement  manifesting as right shoulder pain with probable right tennis elbow(epicondylitis):  Recently diagnosed per her sports medicine physician. She continues to experience significant shoulder pain. Will order an x-ray for her right shoulder.May follow up with Sports Medicine physician, Dr. Pearletha Forge.He suggests conservative treatment of these issues -perhaps PT if symptoms continue.May try Meloxicam 15 mg daily with a meal.  Nasal congestion:  She complains of morning nasal congestion. Recommend treating this with Flonase, administer 2 sprays per nostril.May have allergy testing if symptoms persist.  History of Vitamin D Deficiency:  Continue taking Drisdol 50,000 units weekly capsule by mouth   History of Hyperlipidemia treated with rosuvastatin 5 mg daily. Lipid panel normal. Total cholesterol has improved from 213 to 186.   History of IBS treated with dicyclomine 20 mg half hour before meals.  Possible tennis elbow(lateral epicondylitis)- may treat as needed with ice 20 min 2-3 times daily and Meloxicam. 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  Return in 6 months for CPE and fasting labs Right shoulder Xray is normal.  I,Alexander Ruley,acting as a scribe for Margaree Mackintosh, MD.,have  documented all relevant documentation on the behalf of Margaree Mackintosh, MD,as directed by  Margaree Mackintosh, MD while in the presence of Margaree Mackintosh, MD.   I, Margaree Mackintosh, MD, have reviewed all documentation for this visit. The documentation on 12/19/22 for the exam, diagnosis, procedures, and orders are all accurate and complete.

## 2022-12-18 ENCOUNTER — Other Ambulatory Visit: Payer: BC Managed Care – PPO

## 2022-12-18 ENCOUNTER — Ambulatory Visit: Payer: BC Managed Care – PPO | Admitting: Family Medicine

## 2022-12-18 ENCOUNTER — Encounter: Payer: Self-pay | Admitting: Family Medicine

## 2022-12-18 VITALS — BP 110/69 | HR 75 | Ht 65.0 in | Wt 155.0 lb

## 2022-12-18 DIAGNOSIS — M25511 Pain in right shoulder: Secondary | ICD-10-CM

## 2022-12-18 DIAGNOSIS — Z8639 Personal history of other endocrine, nutritional and metabolic disease: Secondary | ICD-10-CM

## 2022-12-18 DIAGNOSIS — M25521 Pain in right elbow: Secondary | ICD-10-CM | POA: Diagnosis not present

## 2022-12-18 DIAGNOSIS — M25512 Pain in left shoulder: Secondary | ICD-10-CM

## 2022-12-18 DIAGNOSIS — E7800 Pure hypercholesterolemia, unspecified: Secondary | ICD-10-CM

## 2022-12-18 NOTE — Progress Notes (Signed)
 Lab only 

## 2022-12-18 NOTE — Patient Instructions (Signed)
You have rotator cuff impingement, less likely arthritis of the [facility] joint causing your pain. Try to avoid painful activities (overhead activities, lifting with extended arm) as much as possible. Aleve 2 tabs twice a day with food OR ibuprofen 3 tabs three times a day with food for pain and inflammation as needed. Can take tylenol in addition to this. Consider physical therapy with transition to home exercise program. Do home exercise program with theraband and scapular stabilization exercises daily 3 sets of 10 once a day.  Your elbow pain is consistent with mild tennis elbow. Consider a counterforce brace for this. Do home exercises most days of the week as directed. Follow up with me in 6 weeks.

## 2022-12-18 NOTE — Progress Notes (Unsigned)
PCP: Margaree Mackintosh, MD  Subjective:   HPI: Patient is a 62 y.o. female here for bilateral shoulder and R elbow pain.  Has been experiencing this pain for about 3 months.  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  Denies any shooting pain.  Denies numbness and tingling.  No trauma to the area.  Denies significant swelling or bruising.  Her pain usually is temporary and improves with ibuprofen.  Today, denies any symptoms.  However, she states she has been off work for a few days.   Past Medical History:  Diagnosis Date   Menopause    Migraine    Varicose veins    Vitamin D deficiency     Current Outpatient Medications on File Prior to Visit  Medication Sig Dispense Refill   benzonatate (TESSALON) 100 MG capsule Take 1 capsule (100 mg total) by mouth 2 (two) times daily as needed for cough. 20 capsule 0   calcium carbonate (OS-CAL) 1250 (500 Ca) MG chewable tablet Chew 1 tablet by mouth daily.     cholecalciferol (VITAMIN D) 1000 UNITS tablet Take 1,000 Units by mouth daily.     dicyclomine (BENTYL) 20 MG tablet One tab one half hour before meals 90 tablet 1   Echinacea 125 MG CAPS Take 1 capsule by mouth every other day.     HYDROcodone bit-homatropine (HYCODAN) 5-1.5 MG/5ML syrup Take 5 mLs by mouth every 8 (eight) hours as needed for cough. 120 mL 0   Multiple Vitamin (MULTIVITAMIN) capsule Take 1 capsule by mouth daily.     Probiotic Product ([employer] ADV PROBIOTIC GUMMIES) CHEW Chew by mouth.     rosuvastatin (CRESTOR) 5 MG tablet Take 1 tablet (5 mg total) by mouth daily. 90 tablet 3   Vitamin D, Ergocalciferol, (DRISDOL) 1.25 MG (50000 UNIT) CAPS capsule TAKE 1 CAPSULE BY MOUTH 1 TIME A WEEK 12 capsule 3   No current facility-administered medications on file prior to visit.    History reviewed. No pertinent surgical history.  No Known  Allergies  LMP 06/05/2010       No data to display              No data to display              Objective:  Physical Exam:  Gen: NAD, comfortable in exam room  B/l shoulders/upper extremities: Inspection: No gross deformity, ecchymosis, swelling Palpation: Right shoulder mildly tender to palpation at [facility] joint but otherwise nontender along clavicle and scapula.  Left shoulder with mild tenderness to palpation at mid clavicle but otherwise nontender along AC joint and scapula.  Right elbow mildly tender to palpation over lateral epicondyle. ROM: Full range of motion of bilateral shoulders and elbows.   Strength: 5/5 strength with shoulder abduction, elbow flexion/extension, and wrist flexion/extension Special tests: Negative Hawkins, Neer's, yergason, empty can bilaterally    Assessment & Plan:  1.  Shoulder pain-unclear etiology given asymptomatic today with unremarkable exam, but suspect possible underlying impingement vs subacromial bursitis that flares up with overuse.  Possible underlying arthritis. Recommend home exercises and conservative management with aleve/ibuprofen and tylenol. If no improvement, would likely benefit from physical therapy.  2. Elbow pain - possible mild lateral epicondylitis, recommend home exercises and counterforce brace.  F/u in 6 weeks

## 2022-12-19 ENCOUNTER — Encounter: Payer: Self-pay | Admitting: Internal Medicine

## 2022-12-19 ENCOUNTER — Encounter: Payer: Self-pay | Admitting: Family Medicine

## 2022-12-19 ENCOUNTER — Ambulatory Visit: Payer: BC Managed Care – PPO | Admitting: Internal Medicine

## 2022-12-19 ENCOUNTER — Ambulatory Visit
Admission: RE | Admit: 2022-12-19 | Discharge: 2022-12-19 | Disposition: A | Discharge status: Home / Self Care | Payer: BC Managed Care – PPO | Source: Ambulatory Visit | Attending: Internal Medicine | Admitting: Internal Medicine

## 2022-12-19 VITALS — BP 102/70 | HR 65 | Resp 16 | Ht 65.0 in | Wt 164.0 lb

## 2022-12-19 DIAGNOSIS — M25511 Pain in right shoulder: Secondary | ICD-10-CM

## 2022-12-19 DIAGNOSIS — E7800 Pure hypercholesterolemia, unspecified: Secondary | ICD-10-CM

## 2022-12-19 DIAGNOSIS — R0981 Nasal congestion: Secondary | ICD-10-CM | POA: Insufficient documentation

## 2022-12-19 DIAGNOSIS — M7711 Lateral epicondylitis, right elbow: Secondary | ICD-10-CM | POA: Diagnosis not present

## 2022-12-19 DIAGNOSIS — Z8639 Personal history of other endocrine, nutritional and metabolic disease: Secondary | ICD-10-CM | POA: Diagnosis not present

## 2022-12-19 DIAGNOSIS — Z8669 Personal history of other diseases of the nervous system and sense organs: Secondary | ICD-10-CM

## 2022-12-19 DIAGNOSIS — E785 Hyperlipidemia, unspecified: Secondary | ICD-10-CM | POA: Insufficient documentation

## 2022-12-19 LAB — VITAMIN D 25 HYDROXY (VIT D DEFICIENCY, FRACTURES): Vit D, 25-Hydroxy: 96 ng/mL (ref 30–100)

## 2022-12-19 LAB — LIPID PANEL
Cholesterol: 186 mg/dL (ref <200)
HDL: 73 mg/dL (ref >=50)
LDL Cholesterol (Calc): 94 mg/dL (calc)
Non-HDL Cholesterol (Calc): 113 mg/dL (calc) (ref <130)
Total CHOL/HDL Ratio: 2.5 (calc) (ref <5.0)
Triglycerides: 98 mg/dL (ref <150)

## 2022-12-19 MED ORDER — MELOXICAM 15 MG PO TABS: ORAL_TABLET | ORAL | 1 refills | Status: DC

## 2022-12-19 MED ORDER — RIZATRIPTAN BENZOATE 10 MG PO TABS: 10.0000 mg | ORAL_TABLET | ORAL | 1 refills | Status: AC | PRN

## 2022-12-19 NOTE — Patient Instructions (Signed)
Try Maxalt at onset of migraine headaches. Continue with Vitamin D supplement. Patient requests shoulder Xray so this was ordered. May try Meloicam 15 mg with a meal if needed for acute musculoskeletal pain of shoulder. May need  elbow splint for epicondylitis.

## 2023-01-03 ENCOUNTER — Other Ambulatory Visit: Payer: Self-pay | Admitting: Internal Medicine

## 2023-01-08 ENCOUNTER — Encounter: Payer: Self-pay | Admitting: Internal Medicine

## 2023-01-08 ENCOUNTER — Telehealth: Payer: Self-pay

## 2023-01-08 ENCOUNTER — Telehealth (INDEPENDENT_AMBULATORY_CARE_PROVIDER_SITE_OTHER): Payer: BC Managed Care – PPO | Admitting: Internal Medicine

## 2023-01-08 DIAGNOSIS — U071 COVID-19: Secondary | ICD-10-CM

## 2023-01-08 MED ORDER — FLUCONAZOLE 150 MG PO TABS: 150.0000 mg | ORAL_TABLET | Freq: Once | ORAL | 0 refills | Status: AC

## 2023-01-08 MED ORDER — HYDROCODONE BIT-HOMATROP MBR 5-1.5 MG/5ML PO SOLN: 5.0000 mL | Freq: Three times a day (TID) | ORAL | 0 refills | Status: DC | PRN

## 2023-01-08 MED ORDER — AZITHROMYCIN 250 MG PO TABS: ORAL_TABLET | ORAL | 0 refills | Status: AC

## 2023-01-08 NOTE — Telephone Encounter (Signed)
scheduled

## 2023-01-08 NOTE — Patient Instructions (Addendum)
We discussed treatment options for COVID-19.  It has been several days since symptoms had onset and likely a bit late to start Paxlovid.  She will take Zithromax Z-PAK 2 tabs day 1 followed by 1 tab days 2 through 5.  Prescribed Diflucan if needed for Candida vaginitis while on antibiotics.  May take Hycodan 1 teaspoon every 6-8 hours as needed sparingly for cough.  Walk some to prevent atelectasis and stay well-hydrated.  May take Tylenol if needed for fever or headache.

## 2023-01-08 NOTE — Telephone Encounter (Signed)
COVID+

## 2023-01-08 NOTE — Progress Notes (Signed)
Patient Care Team: Margaree Mackintosh, MD as PCP - General (Internal Medicine)  I connected with Carrie Mosley on 01/08/23 at 1:04 PM by video enabled telemedicine visit and verified that I am speaking with the correct person using two identifiers.   I discussed the limitations, risks, security and privacy concerns of performing an evaluation and management service by telemedicine and the availability of in-person appointments. I also discussed with the patient that there may be a patient responsible charge related to this service. The patient expressed understanding and agreed to proceed.   Other persons participating in the visit and their role in the encounter: Medical scribe, Doylene Bode  Patient's location: Home  Provider's location: Clinic   I provided 15 minutes of face-to-face video visit time during this encounter, and > 50% was spent counseling as documented under my assessment & plan. She is identified using two identifiers, Carrie Mosley, a patient of this practice. She is in her home and I am in my office. She is agreeable to using this format today.  Chief Complaint: sinus congestion, fatigue   Subjective:    Patient ID: Carrie Mosley , Female    DOB: [birth year], 62 y.o.    MRN: [id number]   62 y.o. Female presents today for sinus congestion, cough with sputum production, fatigue. Symptoms started with sore throat on 01/04/23. She was feeling somewhat better on 01/05/23 and then developed fatigue, cough, sinus congestion on 01/06/23. Reports positive at-home Covid-19 test.  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     Review of Systems  Constitutional:  Positive for malaise/fatigue.  Negative for fever.  HENT:  Positive for congestion (Sinus) and sore throat.   Eyes:  Negative for blurred vision.  Respiratory:  Positive for cough and sputum production. Negative for shortness of breath.   Cardiovascular:  Negative for chest pain, palpitations and leg swelling.  Gastrointestinal:  Negative for vomiting.  Musculoskeletal:  Negative for back pain.  Skin:  Negative for rash.  Neurological:  Negative for loss of consciousness and headaches.        Objective:   Vitals: LMP 06/05/2010    Physical Exam Vitals and nursing note reviewed.  Constitutional:      General: She is not in acute distress.    Appearance: Normal appearance. She is not toxic-appearing.  HENT:     Head: Normocephalic and atraumatic.  Pulmonary:     Effort: Pulmonary effort is normal.  Skin:    General: Skin is warm and dry.  Neurological:     Mental Status: She is alert and oriented to person, place, and time. Mental status is at baseline.  Psychiatric:        Mood and Affect: Mood normal.        Behavior: Behavior normal.        Thought Content: Thought content normal.        Judgment: Judgment normal.       Results:   Studies obtained and personally reviewed by me:   Labs:       Component Value Date/Time   NA 139 06/19/2022  0940   K 4.4 06/19/2022 0940   CL 101 06/19/2022 0940   CO2 31 06/19/2022 0940   GLUCOSE 79 06/19/2022 0940   BUN 10 06/19/2022 0940   CREATININE 0.93 06/19/2022 0940   CALCIUM 9.7 06/19/2022 0940   PROT 7.4 06/19/2022 0940   ALBUMIN 4.6 03/21/2021 1445   AST 20 06/19/2022 0940   ALT 9 06/19/2022 0940   ALKPHOS 103 03/21/2021 1445   BILITOT 0.6 06/19/2022 0940   GFRNONAA >60 03/21/2021 1445   GFRNONAA 80 04/07/2019 1000   GFRAA 93 04/07/2019 1000     Lab Results  Component Value Date   WBC 2.8 (L) 06/19/2022   HGB 15.0 06/19/2022   HCT 45.0 06/19/2022   MCV 83.8 06/19/2022   PLT 193 06/19/2022    Lab Results  Component Value Date    CHOL 186 12/18/2022   HDL 73 12/18/2022   LDLCALC 94 12/18/2022   TRIG 98 12/18/2022   CHOLHDL 2.5 12/18/2022    No results found for: "HGBA1C"   Lab Results  Component Value Date   TSH 2.67 06/19/2022      Assessment & Plan:   Acute Covid-19 infection:  Has productive cough. Haveprescribed Zithromax Z-Pak two tabs day 1 followed by one tab days 2-5, Hycodan syrup 1 tsp every 8 hours as needed for cough, Diflucan 150 mg once if needed for Candida vaginitis. Rest, eat ,and drink well, and walk regularly. Contact us if symptoms worsen or do not improve.  We discussed Paxlovid but feel it may be a bit too late to start it and she is slowly improving.   I,Alexander Ruley,acting as a Neurosurgeon for Margaree Mackintosh, MD.,have documented all relevant documentation on the behalf of Margaree Mackintosh, MD,as directed by  Margaree Mackintosh, MD while in the presence of Margaree Mackintosh, MD.   I, Margaree Mackintosh, MD, have reviewed all documentation for this visit. The documentation on 01/08/23 for the exam, diagnosis, procedures, and orders are all accurate and complete.

## 2023-01-08 NOTE — Telephone Encounter (Signed)
Patient called with stuffy head and nose. She has a cough no fever. She has been asked to take a covid test and call back with results so we can make appt.

## 2023-03-26 ENCOUNTER — Other Ambulatory Visit: Payer: Self-pay | Admitting: Internal Medicine

## 2023-03-26 DIAGNOSIS — Z1231 Encounter for screening mammogram for malignant neoplasm of breast: Secondary | ICD-10-CM

## 2023-03-30 ENCOUNTER — Encounter: Payer: Self-pay | Admitting: Internal Medicine

## 2023-03-30 ENCOUNTER — Ambulatory Visit: Payer: BC Managed Care – PPO | Admitting: Internal Medicine

## 2023-03-30 VITALS — BP 102/70 | HR 72 | Temp 98.6°F | Ht 65.0 in | Wt 162.0 lb

## 2023-03-30 DIAGNOSIS — Z7184 Encounter for health counseling related to travel: Secondary | ICD-10-CM | POA: Diagnosis not present

## 2023-03-30 DIAGNOSIS — R059 Cough, unspecified: Secondary | ICD-10-CM

## 2023-03-30 DIAGNOSIS — J22 Unspecified acute lower respiratory infection: Secondary | ICD-10-CM | POA: Diagnosis not present

## 2023-03-30 MED ORDER — METHYLPREDNISOLONE ACETATE 80 MG/ML IJ SUSP
80.0000 mg | Freq: Once | INTRAMUSCULAR | Status: AC
  Administered 2023-03-30: 80 mg via INTRAMUSCULAR

## 2023-03-30 MED ORDER — AZITHROMYCIN 250 MG PO TABS: ORAL_TABLET | ORAL | 0 refills | Status: AC

## 2023-03-30 MED ORDER — CIPROFLOXACIN HCL 500 MG PO TABS: ORAL_TABLET | ORAL | 0 refills | Status: DC

## 2023-03-30 MED ORDER — ONDANSETRON HCL 4 MG PO TABS: 4.0000 mg | ORAL_TABLET | Freq: Three times a day (TID) | ORAL | 0 refills | Status: DC | PRN

## 2023-03-30 MED ORDER — FLUCONAZOLE 150 MG PO TABS: 150.0000 mg | ORAL_TABLET | Freq: Once | ORAL | 0 refills | Status: AC

## 2023-03-30 NOTE — Progress Notes (Unsigned)
   Subjective:    Patient ID: Carrie Mosley, female    DOB: Dec 29, 1960, 62 y.o.   MRN: [id number]  HPI    Review of Systems     Objective:   Physical Exam        Assessment & Plan:

## 2023-03-30 NOTE — Progress Notes (Shared)
Patient Care Team: Margaree Mackintosh, MD as PCP - General (Internal Medicine)  Visit Date: 03/30/23  Subjective:    Patient ID: Carrie Mosley , Female   DOB: 07-10-1960, 62 y.o.    MRN: [id number]   62 y.o. Female presents today for sick visit, medication counseling regarding a trip to [place]. She leaves on 04/14/23.  She has been experiencing a dry cough, rhinorrhea, head congestion, ear congestion recently. Symptoms started with ear and head congestion three weeks ago and she started coughing one week ago. Reports negative at-home Covid-19 test. Denies sore throat, sputum production, fever. She is taking Tessalon Perles, cough syrup.  Reports colonoscopy and mammogram are scheduled for 06/2023.  Past Medical History:  Diagnosis Date   Menopause    Migraine    Varicose veins    Vitamin D deficiency      Family History  Problem Relation Age of Onset   Heart disease Father    Diabetes Father    Diabetes Brother    Breast cancer Maternal Aunt     Social history: Single: Never married.  Non-smoker.  Works for [employer]     Review of Systems  Constitutional:  Negative for fever and malaise/fatigue.  HENT:  Positive for congestion (Head) and ear pain (Congestion). Negative for sore throat.        (+) Rhinorrhea  Eyes:  Negative for blurred vision.  Respiratory:  Positive for cough. Negative for sputum production and shortness of breath.   Cardiovascular:  Negative for chest pain, palpitations and leg swelling.  Gastrointestinal:  Negative for vomiting.  Musculoskeletal:  Negative for back pain.  Skin:  Negative for rash.  Neurological:  Negative for loss of consciousness and headaches.        Objective:   Vitals: BP 102/70   Pulse 72   Temp 98.6 F (37 C)   Ht 5\' 5"  (1.651 m)   Wt 162 lb (73.5 kg)   LMP 06/05/2010   SpO2 98%   BMI 26.96 kg/m    Physical Exam Vitals and nursing note reviewed.  Constitutional:      General: She is not in acute  distress.    Appearance: Normal appearance. She is not toxic-appearing.  HENT:     Head: Normocephalic and atraumatic.     Right Ear: Hearing, tympanic membrane, ear canal and external ear normal.     Left Ear: Hearing, tympanic membrane, ear canal and external ear normal.     Mouth/Throat:     Pharynx: Oropharynx is clear. No oropharyngeal exudate.  Pulmonary:     Effort: Pulmonary effort is normal. No respiratory distress.     Breath sounds: No wheezing or rales.  Skin:    General: Skin is warm and dry.  Neurological:     Mental Status: She is alert and oriented to person, place, and time. Mental status is at baseline.  Psychiatric:        Mood and Affect: Mood normal.        Behavior: Behavior normal.        Thought Content: Thought content normal.        Judgment: Judgment normal.       Results:   Studies obtained and personally reviewed by me:   Labs:       Component Value Date/Time   NA 139 06/19/2022 0940   K 4.4 06/19/2022 0940   CL 101 06/19/2022 0940   CO2 31 06/19/2022 0940   GLUCOSE 79  06/19/2022 0940   BUN 10 06/19/2022 0940   CREATININE 0.93 06/19/2022 0940   CALCIUM 9.7 06/19/2022 0940   PROT 7.4 06/19/2022 0940   ALBUMIN 4.6 03/21/2021 1445   AST 20 06/19/2022 0940   ALT 9 06/19/2022 0940   ALKPHOS 103 03/21/2021 1445   BILITOT 0.6 06/19/2022 0940   GFRNONAA >60 03/21/2021 1445   GFRNONAA 80 04/07/2019 1000   GFRAA 93 04/07/2019 1000     Lab Results  Component Value Date   WBC 2.8 (L) 06/19/2022   HGB 15.0 06/19/2022   HCT 45.0 06/19/2022   MCV 83.8 06/19/2022   PLT 193 06/19/2022    Lab Results  Component Value Date   CHOL 186 12/18/2022   HDL 73 12/18/2022   LDLCALC 94 12/18/2022   TRIG 98 12/18/2022   CHOLHDL 2.5 12/18/2022    No results found for: "HGBA1C"   Lab Results  Component Value Date   TSH 2.67 06/19/2022      Assessment & Plan:   Persistent upper respiratory infection: administered Depo-Medrol 80 mg IM today.  Contact us if symptoms worsen or fail to improve.  Given Zofran, Z-Pak, Ciprofloxacin, Diflucan for upcoming travel to [place].  Explained potential situations to use each antibiotic i.e. ciprofloxacin for suspected food poisoning or urinary tract infection, Diflucan for Candida vaginitis and Zithromax Z-PAK for upper respiratory infection/pneumonia.  Vaccine counseling: reports she received a Covid-19 booster on 03/17/23. Suggest  flu vaccine prior to travel.  Time spent evaluating patient for upper respiratory infection and travel advice encounter is 30 minutes  I,Alexander Ruley,acting as a scribe for Margaree Mackintosh, MD.,have documented all relevant documentation on the behalf of Margaree Mackintosh, MD,as directed by  Margaree Mackintosh, MD while in the presence of Margaree Mackintosh, MD.   I, Margaree Mackintosh, MD, have reviewed all documentation for this visit. The documentation on 04/04/23 for the exam, diagnosis, procedures, and orders are all accurate and complete.

## 2023-04-03 ENCOUNTER — Encounter: Payer: Self-pay | Admitting: Internal Medicine

## 2023-04-03 ENCOUNTER — Telehealth: Payer: Self-pay | Admitting: Internal Medicine

## 2023-04-03 ENCOUNTER — Telehealth (INDEPENDENT_AMBULATORY_CARE_PROVIDER_SITE_OTHER): Payer: BC Managed Care – PPO | Admitting: Internal Medicine

## 2023-04-03 DIAGNOSIS — R053 Chronic cough: Secondary | ICD-10-CM | POA: Diagnosis not present

## 2023-04-03 MED ORDER — FLUCONAZOLE 150 MG PO TABS: 150.0000 mg | ORAL_TABLET | Freq: Once | ORAL | 0 refills | Status: DC

## 2023-04-03 MED ORDER — METHYLPREDNISOLONE 4 MG PO TABS: ORAL_TABLET | ORAL | 0 refills | Status: DC

## 2023-04-03 MED ORDER — FLUCONAZOLE 150 MG PO TABS: 150.0000 mg | ORAL_TABLET | Freq: Once | ORAL | 0 refills | Status: AC

## 2023-04-03 MED ORDER — FLUCONAZOLE 150 MG PO TABS: 150.0000 mg | ORAL_TABLET | Freq: Once | ORAL | 1 refills | Status: DC

## 2023-04-03 MED ORDER — DOXYCYCLINE HYCLATE 100 MG PO TABS: 100.0000 mg | ORAL_TABLET | Freq: Two times a day (BID) | ORAL | 0 refills | Status: DC

## 2023-04-03 MED ORDER — HYDROCODONE BIT-HOMATROP MBR 5-1.5 MG/5ML PO SOLN: 5.0000 mL | Freq: Three times a day (TID) | ORAL | 0 refills | Status: DC | PRN

## 2023-04-03 NOTE — Telephone Encounter (Signed)
Carrie Mosley [phone]  Sameena called to say her cough is not much better today. Still dry cough.

## 2023-04-03 NOTE — Patient Instructions (Addendum)
Patient will take doxycycline 100 mg twice daily for 7 days.  She will take methylprednisolone in a tapering course starting with 6 tablets day 1 and decreasing by 1 tablet daily i.e. 6-5-4-3-2-1 taper.  She has Diflucan on hand if she develops Candida vaginitis while on antibiotics and steroids.  I am sending in a new prescription for Hycodan 1 teaspoon every 8 hours as needed for cough.  We can perform a chest x-ray if not improving within a few days.  May consider taking an acid blocker for couple of weeks to help with any acid reflux issues.

## 2023-04-03 NOTE — Progress Notes (Signed)
   Subjective:    Patient ID: Carrie Mosley, female    DOB: 1960-08-21, 62 y.o.   MRN: [id number]  HPI I connected today with Carrie Mosley via Librarian, academic.  Patient is in [place] ,[place] and will not be able to get back today to be seen in the office.  She is agreeable to visit in this format today.  She is at the airport in [facility] and I am at my office.    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  She will not be back in town until late this evening.  Carrie Mosley has an upcoming trip to [place].   She had tested positive for COVID-19 virus infection on August 5.  She was seen by me via telemedicine visit on August 5.   We did discuss Paxlovid therapy but with shared decision making  decided to go with Zithromax instead.    We did not hear back from her until October 25.  Patient wanted to discuss travel medications and was having issues with cough.  Cough was nonproductive and mild but annoying.  She had no fever or shaking chills.  No myalgias.  No nausea or vomiting.  No headache.  She was given Depo-Medrol 80 mg IM in the office.  Today, patient calls and says cough is not resolved.  Patient is concerned that her cough will not be resolved by the time she leaves for [place].  She has had no fever or shaking chills.  No myalgias.  No nausea or vomiting.  No headache.  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    Review of Systems no fever, chills, nausea, vomiting headache     Objective:   Physical Exam  Unable to examine.  Reports no shortness of breath.  Able to speak clearly and currently is not coughing at the time of this visit      Assessment & Plan:  Protracted cough  Plan: Cough is persistent since at least October 25.  Unlikely to be due to COVID-19 virus infection that she had  August 5.  This cough may be allergy related or have some component of secondary bacterial infection.  GE reflux may cause protracted cough as well.  I am going to send in prescription for doxycycline 100 mg twice daily for 7 days.  I am going to send in a 6-day Medrol Dosepak to start with 6 tablets day 1 and decrease by 1 tablet daily.  I.e. 6-5-4-3-2-1 taper.  Also, I will send in Hycodan 1 teaspoon every 8 hours as needed for cough.  We can perform a chest x-ray if not improving within a few days

## 2023-04-04 NOTE — Patient Instructions (Addendum)
For respiratory infection while traveling, I have prescribed Zithromax 2 tabs day 1 followed by 1 tab days 2 through 5.  Today, given 80 mg IM Depo-Medrol for protracted cough and congestion.  This may help settle down recent respiratory chest and symptoms.  Also for up coming travel to [place] have prescribed for you Cipro 500 mg twice daily for 7 days for possible urinary tract infection.  Have prescribed Zofran 4 mg every 8 hours if needed for nausea while traveling and Diflucan 150 mg if needed for Candida vaginitis.  She leaves for [place] on November 9.

## 2023-05-31 ENCOUNTER — Ambulatory Visit: Payer: Self-pay | Admitting: Internal Medicine

## 2023-05-31 NOTE — Telephone Encounter (Signed)
Copied from CRM [id number]. Topic: Clinical - Pink Word Triage >> May 31, 2023 12:25 PM Donita Brooks wrote: Reason for Triage: pt has a ring stuck on her finger, finger been stuck since yesterday.  Chief Complaint: stuck ring on middle finger. Symptoms: swelling pain and bruising Frequency: stuck yesterday and still on finger Pertinent Negatives: Patient denies coolness to finger, fever  Disposition: [x] ED /[] Urgent Care (no appt availability in office) / [] Appointment(In office/virtual)/ []  [facility]/ [] Home Care/ [] Refused Recommended Disposition /[]  Mobile Bus/ []  Follow-up with PCP Additional Notes: patient reports getting ring stuck on middle finger yesterday.  States she has tried everything to get it removed but has been alone and cannot remove it.  Instructed to go to the ER.    Reason for Disposition  [1] Numbness (i.e., loss of sensation) in hand or fingers AND [2] new-onset  Answer Assessment - Initial Assessment Questions 1. ONSET: "When did the pain start?"      Middle finger on right hand 2. LOCATION and RADIATION: "Where is the pain located?"  (e.g., fingertip, around nail, joint, entire  finger)      Pain is not too bad, took advil 3. SEVERITY: "How bad is the pain?" "What does it keep you from doing?"   (Scale 1-10; or mild, moderate, severe)  - MILD (1-3): doesn't interfere with normal activities.   - MODERATE (4-7): interferes with normal activities or awakens from sleep.  - SEVERE (8-10): excruciating pain, unable to hold a glass of water or bend finger even a little.     moderate 4. APPEARANCE: "What does the finger look like?" (e.g., redness, swelling, bruising, pallor)     Swelling at the knuckle. 6. CAUSE: "What do you think is causing the pain?"     Ring stuck on finger.   8. OTHER SYMPTOMS: "Do you have any other symptoms?" (e.g., fever, neck pain, numbness)     Denies.  Protocols used: Finger Pain-A-AH

## 2023-06-21 ENCOUNTER — Other Ambulatory Visit: Payer: BC Managed Care – PPO

## 2023-06-22 ENCOUNTER — Encounter: Payer: Self-pay | Admitting: Internal Medicine

## 2023-06-22 ENCOUNTER — Ambulatory Visit: Payer: BC Managed Care – PPO | Admitting: Internal Medicine

## 2023-06-22 ENCOUNTER — Other Ambulatory Visit: Payer: BC Managed Care – PPO

## 2023-06-22 VITALS — BP 120/80 | HR 74 | Ht 65.0 in | Wt 159.0 lb

## 2023-06-22 DIAGNOSIS — Z23 Encounter for immunization: Secondary | ICD-10-CM | POA: Diagnosis not present

## 2023-06-22 DIAGNOSIS — Z8639 Personal history of other endocrine, nutritional and metabolic disease: Secondary | ICD-10-CM

## 2023-06-22 DIAGNOSIS — K580 Irritable bowel syndrome with diarrhea: Secondary | ICD-10-CM

## 2023-06-22 DIAGNOSIS — E7800 Pure hypercholesterolemia, unspecified: Secondary | ICD-10-CM

## 2023-06-22 DIAGNOSIS — Z8669 Personal history of other diseases of the nervous system and sense organs: Secondary | ICD-10-CM

## 2023-06-22 DIAGNOSIS — Z0000 Encounter for general adult medical examination without abnormal findings: Secondary | ICD-10-CM

## 2023-06-22 DIAGNOSIS — Z1329 Encounter for screening for other suspected endocrine disorder: Secondary | ICD-10-CM

## 2023-06-22 LAB — POCT URINALYSIS DIP (CLINITEK)
Bilirubin, UA: NEGATIVE
Blood, UA: NEGATIVE
Glucose, UA: NEGATIVE mg/dL
Ketones, POC UA: NEGATIVE mg/dL
Leukocytes, UA: NEGATIVE
Nitrite, UA: NEGATIVE
POC PROTEIN,UA: NEGATIVE
Spec Grav, UA: 1.01 (ref 1.010–1.025)
Urobilinogen, UA: 0.2 U/dL
pH, UA: 7 (ref 5.0–8.0)

## 2023-06-22 MED ORDER — MELOXICAM 15 MG PO TABS: 15.0000 mg | ORAL_TABLET | Freq: Every day | ORAL | 1 refills | Status: DC

## 2023-06-22 NOTE — Progress Notes (Signed)
Annual Wellness Visit   Patient Care Team: Margaree Mackintosh, MD as PCP - General (Internal Medicine)  Visit Date: 06/22/23   Chief Complaint  Patient presents with   Annual Exam   Subjective:  Patient: Carrie Mosley, Female DOB: 31-Oct-1960, 63 y.o. MRN: [id number]  Carrie Mosley is a 63 y.o. Female who presents today for her Annual Wellness Visit. Patient has history of Migraine Headaches, HLD, and Vitamin-D Deficiency.  She reports recurrence of chest wall pain that had initially onset in 2010 shortly after her mother's passing.   History of Migraine Headaches treated with 10 mg Maxalt PRN  History of Hyperlipidemia treated with 5 mg Rosuvastatin daily. Awaiting Lipid Panel results.   Labs collected 06/22/23 CBC CMP TSH  Colonoscopy 06/18/23 was normal with repeat recommendation of 2025  PAP Smear on 05/05/19 was normal with repeat recommendation of 2025.   Mammogram on 06/26/2022 was normal with a repeat recommendation of 2025.  Bone Density on 09/05/21 with T-score -2.3, indicates osteopenia, and a repeat recommendation of 2025. Takes 1.25 mg Vitamin-D weekly.   Vaccine Counseling: Due for Covid-19 and Flu; UTD on Tdap and Shingrix.  Past Medical History:  Diagnosis Date   Menopause    Migraine    Varicose veins    Vitamin D deficiency   Medical/Surgical History Narrative:   She had a CT of the abdomen when she was seen in the Emergency Department by Dr. Fredderick Phenix in 2022. found no etiology on CT of abdomen and pelvis to explain her pain. Was found to have a calcified uterine fibroid on the left.   Became menopausal in 2011.  History of vitamin D deficiency.  Likely has lactose intolerance.  History of recurrent bacterial vaginosis but no episodes in some time.  History of occasional migraine headaches but these have lessened as she has gotten older.  History of kidney stone in 1999.  History of benign cardiac murmur.  She has had considerable issues with  recurrent bouts of otitis media with her job as a Financial controller and this is improved over the past few years.  She saw Dr. Preston Fleeting very in April 2012 when he felt she had eustachian tube dysfunction.  She was allergy tested in 2003 and there was no hypersensitivity to a screening panel of allergens.  At that time CT of the sinuses was obtained showing no sinus disease.  Family History  Problem Relation Age of Onset   Heart disease Father    Diabetes Father    Diabetes Brother    Breast cancer Maternal Aunt   Family History Narrative:   Mother died of complications of Diabetes with Septicemia.  Father died of heart failure in his 68s with history of Diabetes and Coronary Artery Disease. No sisters.  Social History   Social History Narrative   Single, never married. No children. Rarely drinks alcohol. Does not smoke. 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   06/22/23 - Recently went to [place].    Review of Systems  Constitutional:  Negative for chills, fever, malaise/fatigue and weight loss.  HENT:  Negative for hearing loss, sinus pain and sore throat.   Respiratory:  Negative for cough, hemoptysis and shortness of breath.   Cardiovascular:  Negative for chest pain, palpitations, leg swelling and PND.  Gastrointestinal:  Negative for abdominal pain, constipation, diarrhea, heartburn, nausea and  vomiting.  Genitourinary:  Negative for dysuria, frequency and urgency.  Musculoskeletal:  Negative for back pain, myalgias and neck pain.       (+) Muscle cramps  Skin:  Negative for itching and rash.  Neurological:  Negative for dizziness, tingling, seizures and headaches.  Endo/Heme/Allergies:  Negative for polydipsia.  Psychiatric/Behavioral:  Negative for depression. The patient is not nervous/anxious.     Objective:  Vitals: BP 120/80   Pulse 74   Ht 5\' 5"   (1.651 m)   Wt 159 lb (72.1 kg)   LMP 06/05/2010   SpO2 97%   BMI 26.46 kg/m  Physical Exam Vitals and nursing note reviewed.  Constitutional:      General: She is not in acute distress.    Appearance: Normal appearance. She is not ill-appearing or toxic-appearing.  HENT:     Head: Normocephalic and atraumatic.     Right Ear: Hearing, tympanic membrane, ear canal and external ear normal.     Left Ear: Hearing, tympanic membrane, ear canal and external ear normal.     Mouth/Throat:     Pharynx: Oropharynx is clear.  Eyes:     Extraocular Movements: Extraocular movements intact.     Pupils: Pupils are equal, round, and reactive to light.  Neck:     Thyroid: No thyroid mass, thyromegaly or thyroid tenderness.     Vascular: No carotid bruit.  Cardiovascular:     Rate and Rhythm: Normal rate and regular rhythm. No extrasystoles are present.    Pulses:          Dorsalis pedis pulses are 1+ on the right side and 1+ on the left side.     Heart sounds: Normal heart sounds. No murmur heard.    No friction rub. No gallop.  Pulmonary:     Effort: Pulmonary effort is normal.     Breath sounds: Normal breath sounds. No decreased breath sounds, wheezing, rhonchi or rales.  Chest:     Chest wall: Tenderness present. No mass.  Abdominal:     Palpations: Abdomen is soft. There is no hepatomegaly, splenomegaly or mass.     Tenderness: There is no abdominal tenderness.     Hernia: No hernia is present.  Musculoskeletal:     Cervical back: Normal range of motion.     Right lower leg: No edema.     Left lower leg: No edema.  Lymphadenopathy:     Cervical: No cervical adenopathy.     Upper Body:     Right upper body: No supraclavicular adenopathy.     Left upper body: No supraclavicular adenopathy.  Skin:    General: Skin is warm and dry.  Neurological:     General: No focal deficit present.     Mental Status: She is alert and oriented to person, place, and time. Mental status is at  baseline.     Sensory: Sensation is intact.     Motor: Motor function is intact. No weakness.     Deep Tendon Reflexes: Reflexes are normal and symmetric.  Psychiatric:        Attention and Perception: Attention normal.        Mood and Affect: Mood normal.        Speech: Speech normal.        Behavior: Behavior normal.        Thought Content: Thought content normal.        Cognition and Memory: Cognition normal.        Judgment:  Judgment normal.   Most recent fall risk assessment:    06/20/2022   11:07 AM  Fall Risk   Falls in the past year? 0  Number falls in past yr: 0  Injury with Fall? 0  Risk for fall due to : No Fall Risks  Follow up Falls prevention discussed    Most recent depression screenings:    06/22/2023   11:13 AM 06/20/2022   11:07 AM  PHQ 2/9 Scores  PHQ - 2 Score 0 0   Results:  Studies obtained and personally reviewed by me:  Colonoscopy 06/18/23 was normal PAP Smear on 05/05/19 was normal   Mammogram on 06/26/2022 was normal  Bone Density on 09/05/21 with T-score -2.3, indicates osteopenia  Labs:     Component Value Date/Time   NA 139 06/19/2022 0940   K 4.4 06/19/2022 0940   CL 101 06/19/2022 0940   CO2 31 06/19/2022 0940   GLUCOSE 79 06/19/2022 0940   BUN 10 06/19/2022 0940   CREATININE 0.93 06/19/2022 0940   CALCIUM 9.7 06/19/2022 0940   PROT 7.4 06/19/2022 0940   ALBUMIN 4.6 03/21/2021 1445   AST 20 06/19/2022 0940   ALT 9 06/19/2022 0940   ALKPHOS 103 03/21/2021 1445   BILITOT 0.6 06/19/2022 0940   GFRNONAA >60 03/21/2021 1445   GFRNONAA 80 04/07/2019 1000   GFRAA 93 04/07/2019 1000     Lab Results  Component Value Date   WBC 2.8 (L) 06/19/2022   HGB 15.0 06/19/2022   HCT 45.0 06/19/2022   MCV 83.8 06/19/2022   PLT 193 06/19/2022    Lab Results  Component Value Date   CHOL 186 12/18/2022   HDL 73 12/18/2022   LDLCALC 94 12/18/2022   TRIG 98 12/18/2022   CHOLHDL 2.5 12/18/2022   Lab Results  Component Value Date   TSH  2.67 06/19/2022    Assessment & Plan:  Labs collected today: CBC, CMP, Lipid Panel, & TSH. Awaiting Results.   Musculoskeletal Chest Pain: reports recurrence of chest wall pain. Sending in 15 mg Mobic - take 1 (one) tablet daily  Migraine Headaches treated with 10 mg Maxalt PRN  Hyperlipidemia treated with 5 mg Rosuvastatin daily. Awaiting Lipid Panel results.   Colonoscopy on 06/18/23 was normal with repeat recommendation of 2025  PAP Smear on 05/05/19 was normal with repeat recommendation of 2025.   Mammogram on 06/26/2022 was normal with a repeat recommendation of 2025.  Bone Density on 09/05/21 with T-score -2.3, indicates osteopenia, and a repeat recommendation of 2025. Takes 1.25 mg Vitamin-D weekly.   Vaccine Counseling: Received PNA today. Due for Covid-19 and Flu; UTD on Tdap and Shingrix.     Annual wellness visit done today including the all of the following: Reviewed patient's Family Medical History Reviewed and updated list of patient's medical providers Assessment of cognitive impairment was done Assessed patient's functional ability Established a written schedule for health screening services Health Risk Assessent Completed and Reviewed  Discussed health benefits of physical activity, and encouraged her to engage in regular exercise appropriate for her age and condition.        I,Emily Lagle,acting as a Neurosurgeon for Margaree Mackintosh, MD.,have documented all relevant documentation on the behalf of Margaree Mackintosh, MD,as directed by  Margaree Mackintosh, MD while in the presence of Margaree Mackintosh, MD.   I, Margaree Mackintosh, MD, have reviewed all documentation for this visit. The documentation on 07/04/23 for the exam, diagnosis, procedures, and orders  are all accurate and complete.

## 2023-06-23 LAB — COMPLETE METABOLIC PANEL WITH GFR
AG Ratio: 1.3 (calc) (ref 1.0–2.5)
ALT: 9 U/L (ref 6–29)
AST: 16 U/L (ref 10–35)
Albumin: 4.2 g/dL (ref 3.6–5.1)
Alkaline phosphatase (APISO): 126 U/L (ref 37–153)
BUN: 11 mg/dL (ref 7–25)
CO2: 27 mmol/L (ref 20–32)
Calcium: 9.6 mg/dL (ref 8.6–10.4)
Chloride: 101 mmol/L (ref 98–110)
Creat: 0.86 mg/dL (ref 0.50–1.05)
Globulin: 3.3 g/dL (ref 1.9–3.7)
Glucose, Bld: 81 mg/dL (ref 65–99)
Potassium: 4.1 mmol/L (ref 3.5–5.3)
Sodium: 140 mmol/L (ref 135–146)
Total Bilirubin: 1.1 mg/dL (ref 0.2–1.2)
Total Protein: 7.5 g/dL (ref 6.1–8.1)
eGFR: 77 mL/min/{1.73_m2} (ref >=60)

## 2023-06-23 LAB — CBC WITH DIFFERENTIAL/PLATELET
Absolute Lymphocytes: 1026 {cells}/uL (ref 850–3900)
Absolute Monocytes: 216 {cells}/uL (ref 200–950)
Basophils Absolute: 21 {cells}/uL (ref 0–200)
Basophils Relative: 0.7 %
Eosinophils Absolute: 60 {cells}/uL (ref 15–500)
Eosinophils Relative: 2 %
HCT: 46.1 % — ABNORMAL HIGH (ref 35.0–45.0)
Hemoglobin: 14.9 g/dL (ref 11.7–15.5)
MCH: 27.4 pg (ref 27.0–33.0)
MCHC: 32.3 g/dL (ref 32.0–36.0)
MCV: 84.9 fL (ref 80.0–100.0)
MPV: 11.2 fL (ref 7.5–12.5)
Monocytes Relative: 7.2 %
Neutro Abs: 1677 {cells}/uL (ref 1500–7800)
Neutrophils Relative %: 55.9 %
Platelets: 204 10*3/uL (ref 140–400)
RBC: 5.43 10*6/uL — ABNORMAL HIGH (ref 3.80–5.10)
RDW: 13.1 % (ref 11.0–15.0)
Total Lymphocyte: 34.2 %
WBC: 3 10*3/uL — ABNORMAL LOW (ref 3.8–10.8)

## 2023-06-23 LAB — LIPID PANEL
Cholesterol: 185 mg/dL (ref <200)
HDL: 76 mg/dL (ref >=50)
LDL Cholesterol (Calc): 94 mg/dL
Non-HDL Cholesterol (Calc): 109 mg/dL (ref <130)
Total CHOL/HDL Ratio: 2.4 (calc) (ref <5.0)
Triglycerides: 66 mg/dL (ref <150)

## 2023-06-23 LAB — TSH: TSH: 1.03 m[IU]/L (ref 0.40–4.50)

## 2023-06-29 ENCOUNTER — Ambulatory Visit
Admission: RE | Admit: 2023-06-29 | Discharge: 2023-06-29 | Disposition: A | Discharge status: Home / Self Care | Payer: BC Managed Care – PPO | Source: Ambulatory Visit | Attending: Internal Medicine | Admitting: Internal Medicine

## 2023-06-29 DIAGNOSIS — Z1231 Encounter for screening mammogram for malignant neoplasm of breast: Secondary | ICD-10-CM

## 2023-07-04 ENCOUNTER — Encounter: Payer: Self-pay | Admitting: Internal Medicine

## 2023-07-04 LAB — HM COLONOSCOPY

## 2023-07-04 NOTE — Patient Instructions (Addendum)
It was a pleasure to see you today.  Pneumococcal 20 vaccine given.  Return in 1 year or as needed.

## 2023-07-25 IMAGING — CT CT RENAL STONE PROTOCOL
3 of 4 series · 8 of 46 positions shown, 15 images · non-contrast
Comparison: None.

CLINICAL DATA: Left lower quadrant pain and flank pain.

EXAM:
CT ABDOMEN AND PELVIS WITHOUT CONTRAST
TECHNIQUE: Multidetector CT imaging of the abdomen and pelvis was performed
following the standard protocol without IV contrast.

[Series 4: lungs · axial · 0.74mm/px · z∈[-146,-86]mm · 4 of 21 slices shown, 9 images]
[im 5/21  soft-tissue]
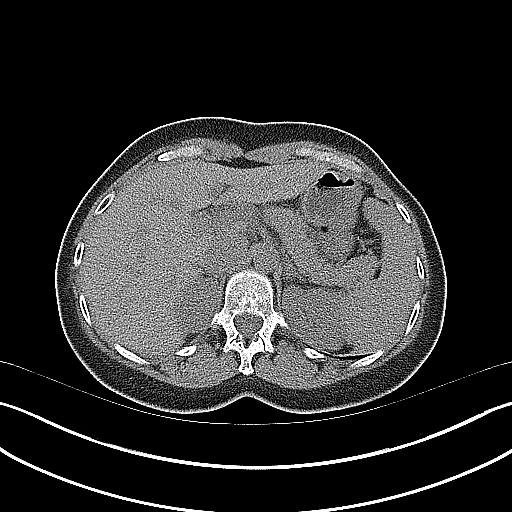
[im 5/21  lung]
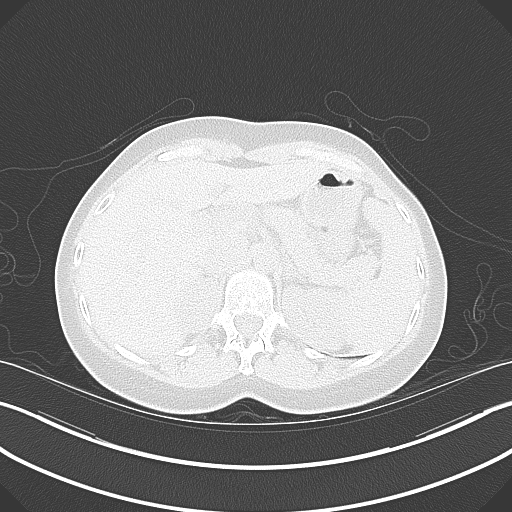
[im 5/21  bone]
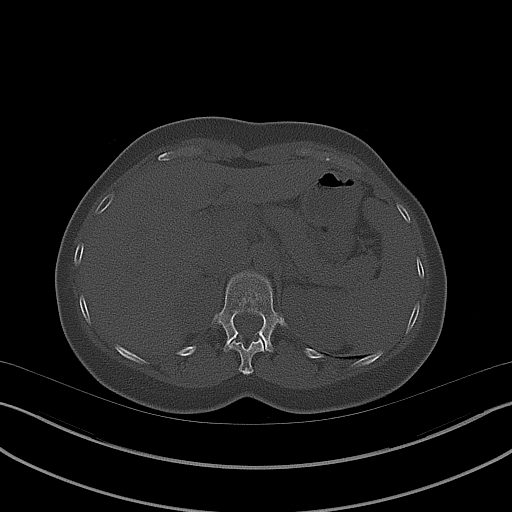
[im 9/21  soft-tissue]
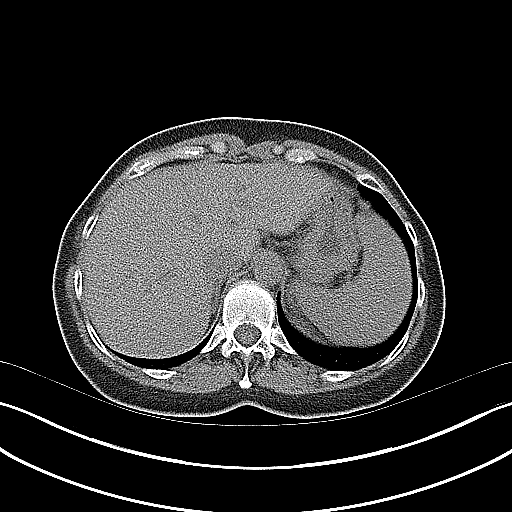
[im 9/21  lung]
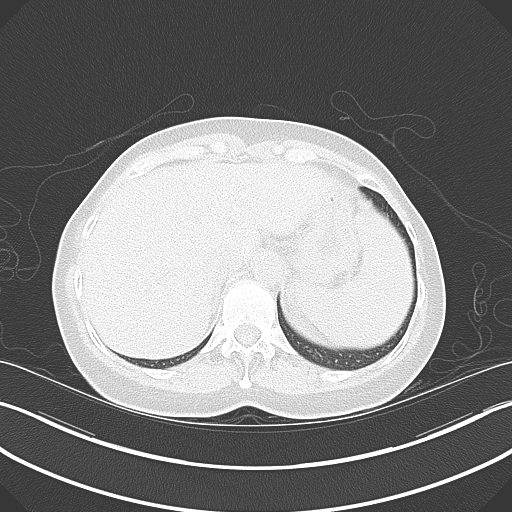
[im 13/21  soft-tissue]
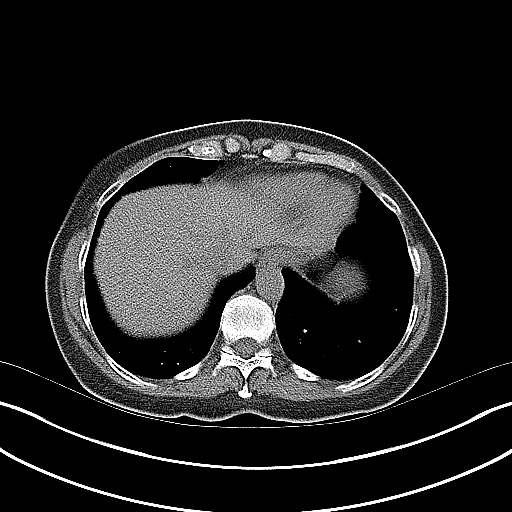
[im 13/21  lung]
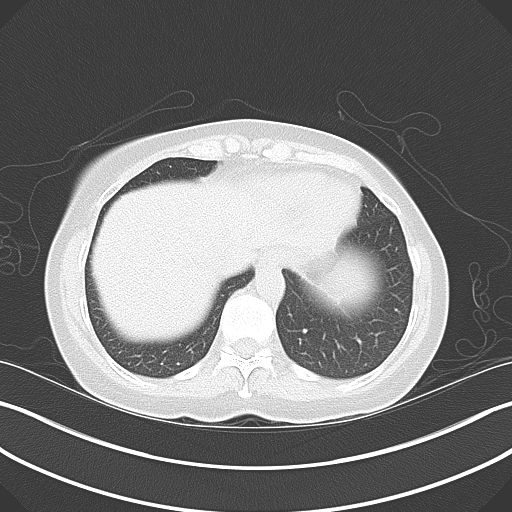
[im 17/21  soft-tissue]
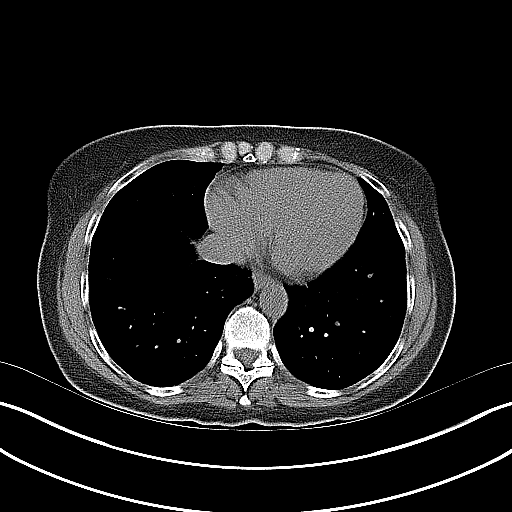
[im 17/21  lung]
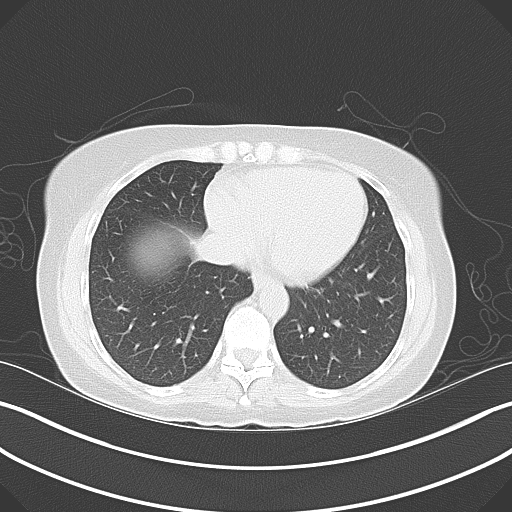

[Series 5: coronal · coronal · 0.75mm/px · 3 of 69 slices shown, 4 images]
[im 23/69  soft-tissue]
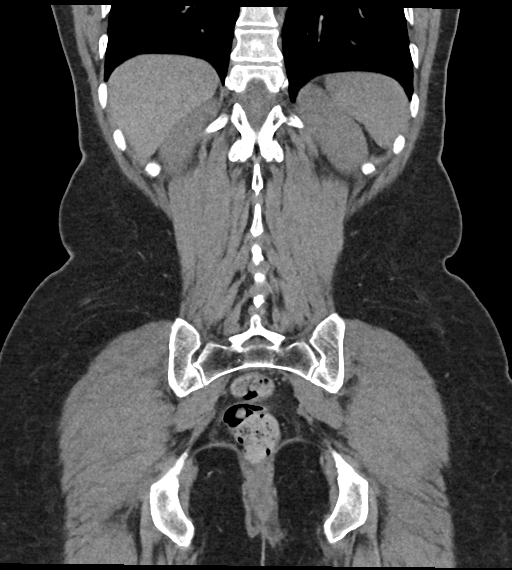
[im 31/69  soft-tissue]
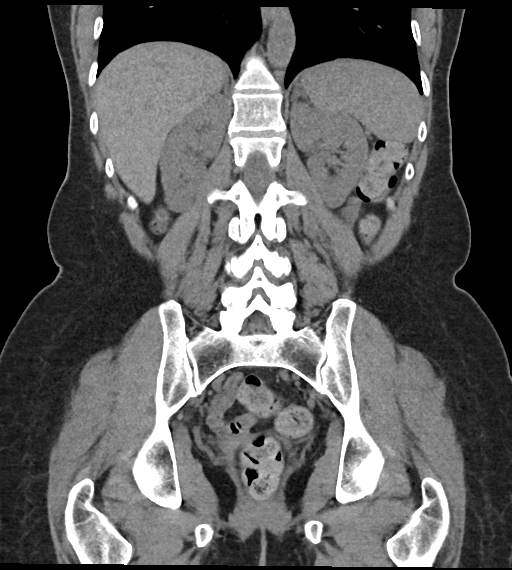
[im 31/69  bone]
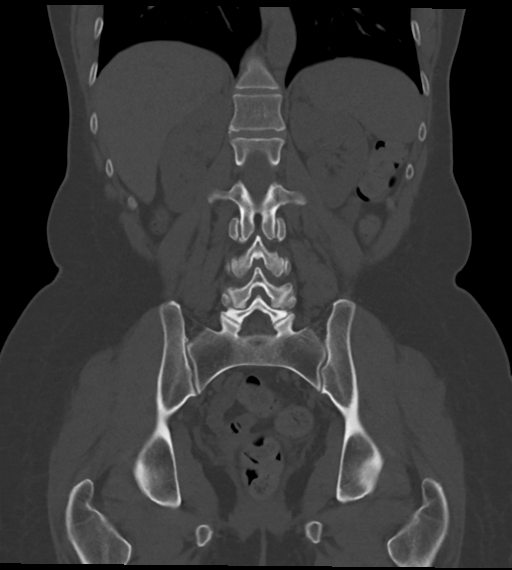
[im 38/69  soft-tissue]
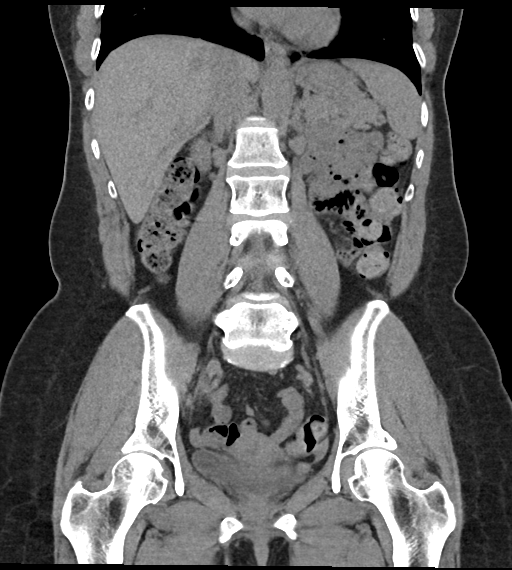

[Series 6: sagittal · sagittal · 0.43mm/px · 1 of 99 slices shown, 2 images]
[im 33/99  soft-tissue]
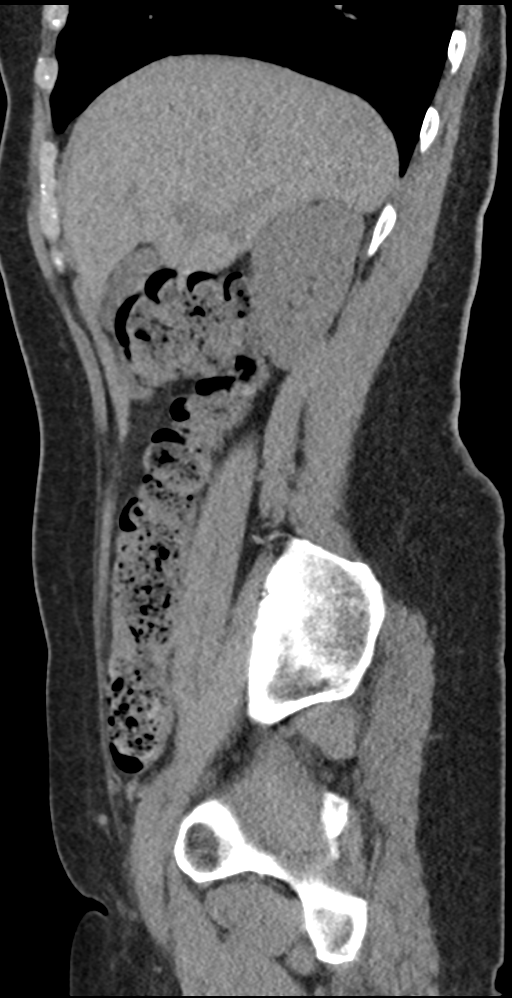
[im 33/99  bone]
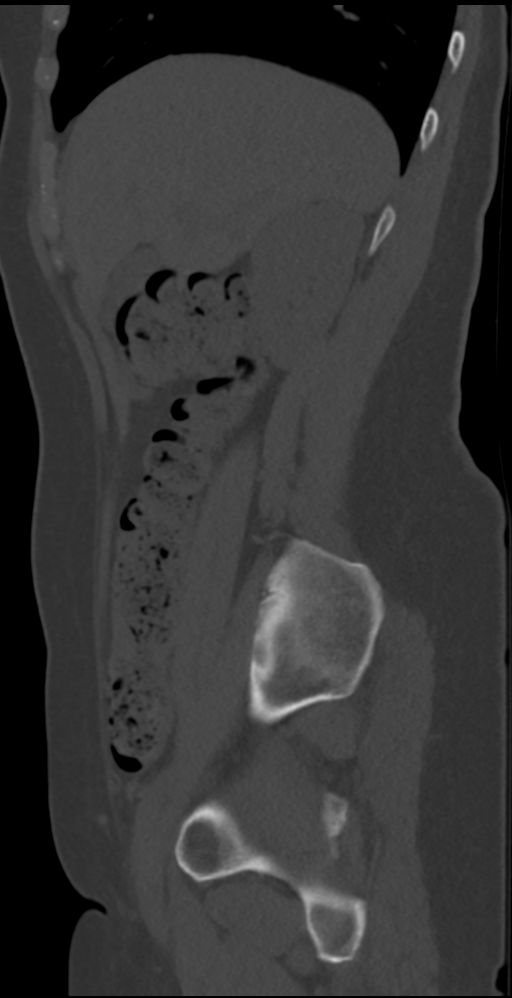

[8 of 46 positions shown; findings below may reference images not displayed]

FINDINGS: Lower chest: No acute abnormality.

Hepatobiliary: No focal liver abnormality is seen. No gallstones,
gallbladder wall thickening, or biliary dilatation.

Pancreas: Unremarkable. No pancreatic ductal dilatation or
surrounding inflammatory changes.

Spleen: Normal in size without focal abnormality.

Adrenals/Urinary Tract: Adrenal glands are unremarkable. Kidneys are
normal, without renal calculi, focal lesion, or hydronephrosis.
Bladder is unremarkable.

Stomach/Bowel: Stomach is within normal limits. Appendix appears
normal. No evidence of bowel wall thickening, distention, or
inflammatory changes.

Vascular/Lymphatic: Aortic atherosclerosis. No enlarged abdominal or
pelvic lymph nodes.

Reproductive: There is a questionable calcified left uterine
fibroid, not well delineated, measuring 15 mm. The uterus and adnexa
are otherwise within normal limits.

Other: No abdominal wall hernia or abnormality. No abdominopelvic
ascites.

Musculoskeletal: No acute or significant osseous findings.
IMPRESSION: 1. No evidence for urinary tract calculus or obstruction.
2. Likely calcified uterine fibroid on the left. This can be further
evaluated with pelvic ultrasound as clinically warranted.
3.  Aortic Atherosclerosis ([id number]).

## 2023-10-16 ENCOUNTER — Other Ambulatory Visit: Payer: Self-pay | Admitting: Internal Medicine

## 2023-12-31 ENCOUNTER — Ambulatory Visit: Payer: Self-pay | Admitting: Internal Medicine

## 2023-12-31 ENCOUNTER — Ambulatory Visit
Admission: RE | Admit: 2023-12-31 | Discharge: 2023-12-31 | Disposition: A | Discharge status: Home / Self Care | Source: Ambulatory Visit | Attending: Internal Medicine | Admitting: Internal Medicine

## 2023-12-31 ENCOUNTER — Encounter: Payer: Self-pay | Admitting: Internal Medicine

## 2023-12-31 ENCOUNTER — Ambulatory Visit: Admitting: Internal Medicine

## 2023-12-31 VITALS — BP 120/80 | HR 88 | Ht 65.0 in

## 2023-12-31 DIAGNOSIS — M25511 Pain in right shoulder: Secondary | ICD-10-CM

## 2023-12-31 DIAGNOSIS — M7711 Lateral epicondylitis, right elbow: Secondary | ICD-10-CM | POA: Diagnosis not present

## 2023-12-31 DIAGNOSIS — M19041 Primary osteoarthritis, right hand: Secondary | ICD-10-CM

## 2023-12-31 DIAGNOSIS — M79641 Pain in right hand: Secondary | ICD-10-CM | POA: Diagnosis not present

## 2023-12-31 DIAGNOSIS — M25521 Pain in right elbow: Secondary | ICD-10-CM | POA: Diagnosis not present

## 2023-12-31 DIAGNOSIS — G5621 Lesion of ulnar nerve, right upper limb: Secondary | ICD-10-CM

## 2023-12-31 MED ORDER — MELOXICAM 15 MG PO TABS: 15.0000 mg | ORAL_TABLET | Freq: Every day | ORAL | 0 refills | Status: DC

## 2023-12-31 NOTE — Progress Notes (Signed)
 Patient Care Team: [name], MD as PCP - General (Internal Medicine)  Visit Date: 12/31/23  Subjective:   Chief Complaint  Patient presents with   Arm Pain    Right arm pain, ELBOW PAIN  Patient PI:Qzoprpj P Teehan,Female DOB:Jan 19, 1961,63 y.o. FMW:[id number]   63 y.o.Female presents today for acute visit with Right Arm/Elbow Pain. Patient has a past medical history of Right Lateral Epicondylitis, for which she was seen 12/2022 by Sports Med, Dr. Cleatrice and treated conservatively. Subsequently, prescribed Meloxicam  15 mg to take daily by me on 12/19/2022. Today she says that she has been having similar pain, though there has been no change in her exertion/activity, and she's used 2 OTC Advil for pain management.   Also mentions having numbness in her right hand, which has woken her up at night, though can not specify whether it may be localized to 4th and 5th finger, Thumb - 2nd Finger, or generalized. Past Medical History:  Diagnosis Date   Menopause    Migraine    Varicose veins    Vitamin D  deficiency   No Known Allergies  Family History  Problem Relation Age of Onset   Heart disease Father    Diabetes Father    Diabetes Brother    Breast cancer Maternal Aunt    Social Hx : Single, never married college degree. Work as Gaffer for many years. Has Master's degree,  Review of Systems  Musculoskeletal:  Positive for joint pain (R. Arm/Elbow).  Neurological:        (+) Numbness, Right Hand     Objective:  Vitals: BP 120/80   Pulse 88   Ht 5' 5 (1.651 m)   LMP 06/05/2010   SpO2 98%   BMI 26.46 kg/m   Physical Exam Vitals and nursing note reviewed.  Constitutional:      General: She is not in acute distress.    Appearance: Normal appearance. She is not toxic-appearing.  HENT:     Head: Normocephalic and atraumatic.  Pulmonary:     Effort: Pulmonary effort is normal.  Skin:    General: Skin is warm and dry.  Neurological:     Mental  Status: She is alert and oriented to person, place, and time. Mental status is at baseline.  Psychiatric:        Mood and Affect: Mood normal.        Behavior: Behavior normal.        Thought Content: Thought content normal.        Judgment: Judgment normal.     Results:  Studies Obtained And Personally Reviewed By Me:  RIGHT SHOULDER - 2+ VIEW 12/19/2022  CLINICAL DATA:  Right shoulder pain for several months.   COMPARISON:  None Available.   FINDINGS: There is no evidence of fracture or dislocation. There is no evidence of arthropathy or other focal bone abnormality. Soft tissues are unremarkable.   IMPRESSION: Negative.   Labs:     Component Value Date/Time   NA 140 06/22/2023 1008   K 4.1 06/22/2023 1008   CL 101 06/22/2023 1008   CO2 27 06/22/2023 1008   GLUCOSE 81 06/22/2023 1008   BUN 11 06/22/2023 1008   CREATININE 0.86 06/22/2023 1008   CALCIUM  9.6 06/22/2023 1008   PROT 7.5 06/22/2023 1008   ALBUMIN 4.6 03/21/2021 1445   AST 16 06/22/2023 1008   ALT 9 06/22/2023 1008   ALKPHOS 103 03/21/2021 1445   BILITOT 1.1 06/22/2023 1008  GFRNONAA >60 03/21/2021 1445   GFRNONAA 80 04/07/2019 1000   GFRAA 93 04/07/2019 1000    Lab Results  Component Value Date   WBC 3.0 (L) 06/22/2023   HGB 14.9 06/22/2023   HCT 46.1 (H) 06/22/2023   MCV 84.9 06/22/2023   PLT 204 06/22/2023   Lab Results  Component Value Date   CHOL 185 06/22/2023   HDL 76 06/22/2023   LDLCALC 94 06/22/2023   TRIG 66 06/22/2023   CHOLHDL 2.4 06/22/2023   Lab Results  Component Value Date   TSH 1.03 06/22/2023   Assessment & Plan:   Orders Placed This Encounter  Procedures   DG Elbow 2 Views Right   DG Shoulder Right   DG Hand Complete Right   Meds ordered this encounter  Medications   meloxicam  (MOBIC ) 15 MG tablet    Sig: Take 1 tablet (15 mg total) by mouth daily.    Dispense:  90 tablet    Refill:  0   Right Arm/Elbow Pain: past medical history of Right Lateral  Epicondylitis, for which she was seen 12/2022 by Sports Med, Dr. Cleatrice and treated conservatively. Subsequently, prescribed Meloxicam  15 mg to take daily by me on 12/19/2022. Today she says that she has been having similar pain, though there has been no change in her exertion/activity, and she's used 2 OTC Advil for pain management. Ordering DG Elbow 2 Views Right and DG Shoulder Right. Sending in Meloxicam  15 mg to take daily. 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 Ordering DG Hand Complete Right. May have median or ulnar nerve entrapment.   Refer to Dr. Camella for evaluation of both problems.  [facility] at [facility] pending.      I,Emily Lagle,acting as a Neurosurgeon for Ronal JINNY Hailstone, MD.,have documented all relevant documentation on the behalf of Ronal JINNY Hailstone, MD,as directed by  Ronal JINNY Hailstone, MD while in the presence of Ronal JINNY Hailstone, MD.   I, Ronal JINNY Hailstone, MD, have reviewed all documentation for this visit. The documentation on 01/01/24 for the exam, diagnosis, procedures, and orders are all accurate and complete.

## 2024-01-01 ENCOUNTER — Encounter: Payer: Self-pay | Admitting: Internal Medicine

## 2024-01-01 NOTE — Patient Instructions (Addendum)
 Tryiong meloxicam  for right hand and elbow issues. May need splint. May apply ice to elbow as needed. Do not pull suitcase with left upper extremity if possible.Referral to Dr. Camella.

## 2024-01-04 ENCOUNTER — Encounter: Payer: Self-pay | Admitting: Internal Medicine

## 2024-01-04 ENCOUNTER — Ambulatory Visit: Admitting: Internal Medicine

## 2024-01-04 VITALS — BP 110/70 | Ht 65.0 in | Wt 159.0 lb

## 2024-01-04 DIAGNOSIS — M7711 Lateral epicondylitis, right elbow: Secondary | ICD-10-CM | POA: Diagnosis not present

## 2024-01-04 DIAGNOSIS — T783XXA Angioneurotic edema, initial encounter: Secondary | ICD-10-CM

## 2024-01-04 MED ORDER — METHYLPREDNISOLONE ACETATE 80 MG/ML IJ SUSP
80.0000 mg | Freq: Once | INTRAMUSCULAR | Status: AC
  Administered 2024-01-04: 80 mg via INTRAMUSCULAR

## 2024-01-04 MED ORDER — METHYLPREDNISOLONE 4 MG PO TABS: 4.0000 mg | ORAL_TABLET | Freq: Every day | ORAL | 0 refills | Status: DC

## 2024-01-04 MED ORDER — EPINEPHRINE 0.3 MG/0.3ML IJ SOAJ: 0.3000 mg | INTRAMUSCULAR | 99 refills | Status: AC | PRN

## 2024-01-04 MED ORDER — FLUCONAZOLE 150 MG PO TABS: 150.0000 mg | ORAL_TABLET | Freq: Once | ORAL | 2 refills | Status: AC

## 2024-01-04 MED ORDER — METHYLPREDNISOLONE 4 MG PO TABS: ORAL_TABLET | ORAL | 1 refills | Status: DC

## 2024-01-04 NOTE — Progress Notes (Signed)
 Patient Care Team: Carrie Ronal PARAS, MD as PCP - General (Internal Medicine)  Visit Date: 01/04/24  Subjective:   Chief Complaint  Patient presents with   Allergic Reaction    Patient states she ate some grapes on Tuesday morning and she experienced mouth swelling, itching and bumps. She took claritin and it hlep some.    Patient PI:Carrie Mosley,Female DOB:February 04, 1961,63 y.o. FMW:[id number]   63 y.o.Female presents today for acute sick visit with Allergic Reaction w/ Facial Swelling, Pruritus, and Urticaria. Patient has a past medical history of Migraines managed with Maxalt  10 mg as needed; Vitamin-D Deficiency managed with Vitamin-D 50,000 units weekly; and Hyperlipidemia treated with Rosuvastatin  5 mg daily. On Monday, 7/28 she was seen in this office for lateral epicondylitis and was prescribed Meloxicam  15 mg daily, which she has taken multiple times before in the past; however, she says that following her reaction on Tuesday morning she stopped taking this. Describes that shortly after eating grapes she began to experience mild swelling of her lips/mouth/throat, urticaria underneath her lower lip, and pruritus. At the time she did have immediate access to an EpiPen, but did not feel that her symptoms were so severe that she required one, so took a Claritin as soon as she returned home, which did relieve her symptoms, and later took 2 Benadryl. Says that the swelling has improved, though is still noticeable around her lower lip. She does note that, while not as severe, she has experienced tongue itching after eating pineapple. On review of her prescribed medications - Vitamin-D, Rosuvastatin , and Maxalt  - she denies ever having similar reactions while taking these.  Past Medical History:  Diagnosis Date   Menopause    Migraine    Varicose veins    Vitamin D  deficiency   No Known Allergies  Family History  Problem Relation Age of Onset   Heart disease Father    Diabetes Father     Diabetes Brother    Breast cancer Maternal Aunt    Social History   Social History Narrative   Not on file   Review of Systems  Skin:  Positive for itching and rash (Urticaria, lower lip).  Endo/Heme/Allergies:        (+) Mouth/Throat Swelling     Objective:  Vitals: BP 110/70   Ht 5' 5 (1.651 m)   Wt 159 lb (72.1 kg)   LMP 06/05/2010   SpO2 98%   BMI 26.46 kg/m   Physical Exam Vitals and nursing note reviewed.  Constitutional:      General: She is not in acute distress.    Appearance: Normal appearance. She is not toxic-appearing.  HENT:     Head: Normocephalic and atraumatic.     Mouth/Throat:     Lips: No lesions.     Mouth: Angioedema (mild, lower lip) present.  Pulmonary:     Effort: Pulmonary effort is normal.  Skin:    General: Skin is warm and dry.  Neurological:     Mental Status: She is alert and oriented to person, place, and time. Mental status is at baseline.  Psychiatric:        Mood and Affect: Mood normal.        Behavior: Behavior normal.        Thought Content: Thought content normal.        Judgment: Judgment normal.     Results:  Studies Obtained And Personally Reviewed By Me: Labs:     Component Value Date/Time  NA 140 06/22/2023 1008   K 4.1 06/22/2023 1008   CL 101 06/22/2023 1008   CO2 27 06/22/2023 1008   GLUCOSE 81 06/22/2023 1008   BUN 11 06/22/2023 1008   CREATININE 0.86 06/22/2023 1008   CALCIUM  9.6 06/22/2023 1008   PROT 7.5 06/22/2023 1008   ALBUMIN 4.6 03/21/2021 1445   AST 16 06/22/2023 1008   ALT 9 06/22/2023 1008   ALKPHOS 103 03/21/2021 1445   BILITOT 1.1 06/22/2023 1008   GFRNONAA >60 03/21/2021 1445   GFRNONAA 80 04/07/2019 1000   GFRAA 93 04/07/2019 1000    Lab Results  Component Value Date   WBC 3.0 (L) 06/22/2023   HGB 14.9 06/22/2023   HCT 46.1 (H) 06/22/2023   MCV 84.9 06/22/2023   PLT 204 06/22/2023   Lab Results  Component Value Date   CHOL 185 06/22/2023   HDL 76 06/22/2023   LDLCALC 94  06/22/2023   TRIG 66 06/22/2023   CHOLHDL 2.4 06/22/2023   Lab Results  Component Value Date   TSH 1.03 06/22/2023    Assessment & Plan:   Orders Placed This Encounter  Procedures   Ambulatory referral to Allergy    Referral Priority:   Routine    Referral Type:   Allergy Testing    Referral Reason:   Specialty Services Required    Requested Specialty:   Allergy    Number of Visits Requested:   1   Meds ordered this encounter  Medications   EPINEPHrine (EPIPEN 2-PAK) 0.3 mg/0.3 mL IJ SOAJ injection    Sig: Inject 0.3 mg into the muscle as needed for anaphylaxis.    Dispense:  1 each    Refill:  PRN   DISCONTD: methylPREDNISolone  (MEDROL ) 4 MG tablet    Sig: Take 1 tablet (4 mg total) by mouth daily.    Dispense:  21 tablet    Refill:  0   fluconazole  (DIFLUCAN ) 150 MG tablet    Sig: Take 1 tablet (150 mg total) by mouth once for 1 dose.    Dispense:  1 tablet    Refill:  2   methylPREDNISolone  (MEDROL ) 4 MG tablet    Sig: Take in tapering course as directed 6-5-4-3-2-1    Dispense:  21 tablet    Refill:  1   methylPREDNISolone  acetate (DEPO-MEDROL ) injection 80 mg   Angioedema, Initial Encounter: Monday, 7/28 she was seen in this office for lateral epicondylitis and was prescribed Meloxicam  15 mg daily, which she has taken multiple times before in the past; however, following her reaction on Tuesday morning she stopped taking this. Reportedly, shortly after eating grapes she began to experience mild swelling of her lips/mouth/throat, urticaria underneath her lower lip, and pruritus. At the time she did have immediate access to an EpiPen, but did not feel that her symptoms were so severe that she required one. Once she returned home, she took a Claritin, which did relieve her symptoms, and later took 2 Benadryl. Swelling has apparently improved, though is still noticeable around her lower lip. Notably, she has experienced tongue itching after eating pineapple, but otherwise no  other similar symptoms. On review of her prescribed medications - Vitamin-D, Rosuvastatin , and Maxalt  - she denies ever having similar reactions while taking these.  Placing referral to Allergy for testing. For her symptoms, she was given Depo-Medrol  80 mg IM in-office today, and I am sending in Medrol  tapering course 6-5-4-3-2-1 with a refill for when she travels, an EpiPen for emergency use,  and Diflucan  150 mg in case she develops Candida vaginitis per her request.     I,Emily Lagle,acting as a scribe for Ronal JINNY Hailstone, MD.,have documented all relevant documentation on the behalf of Ronal JINNY Hailstone, MD,as directed by  Ronal JINNY Hailstone, MD while in the presence of Ronal JINNY Hailstone, MD.   I, Ronal JINNY Hailstone, MD, have reviewed all documentation for this visit. The documentation on 01/04/2024 for the exam, diagnosis, procedures, and orders are all accurate and complete.

## 2024-01-04 NOTE — Patient Instructions (Signed)
 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 Will refer her to Allergist for evaluation. I have prescribed an epipen for her to take with her at all times. Stop Meloxicam . Sending in Medrol  4 mg 6 day dosepack to take in tapering course as directed with 1 refill for travel. She works as an Pensions consultant. May take Difl;ucan tab if she develops Candida vaginitis while on steroids.

## 2024-01-31 NOTE — Progress Notes (Unsigned)
 New Patient Note  RE: Carrie Mosley MRN: [id number] DOB: 08/25/1960 Date of Office Visit: 02/01/2024  Consult requested by: [name], MD Primary care provider: [name], MD  Chief Complaint: No chief complaint on file.  History of Present Illness: I had the pleasure of seeing Carrie Mosley for initial evaluation at the [facility] on 02/01/2024. She is a 63 y.o. female, who is referred here by [name], MD for the evaluation of angioedema.  Discussed the use of AI scribe software for clinical note transcription with the patient, who gave verbal consent to proceed.  History of Present Illness             01/04/2024 PCP visit: Angioedema, Initial Encounter: Monday, 7/28 she was seen in this office for lateral epicondylitis and was prescribed Meloxicam  15 mg daily, which she has taken multiple times before in the past; however, following her reaction on Tuesday morning she stopped taking this. Reportedly, shortly after eating grapes she began to experience mild swelling of her lips/mouth/throat, urticaria underneath her lower lip, and pruritus. At the time she did have immediate access to an EpiPen , but did not feel that her symptoms were so severe that she required one. Once she returned home, she took a Claritin, which did relieve her symptoms, and later took 2 Benadryl. Swelling has apparently improved, though is still noticeable around her lower lip. Notably, she has experienced tongue itching after eating pineapple, but otherwise no other similar symptoms. On review of her prescribed medications - Vitamin-D, Rosuvastatin , and Maxalt  - she denies ever having similar reactions while taking these.             Placing referral to Allergy for testing. For her symptoms, she was given Depo-Medrol  80 mg IM in-office today, and I am sending in Medrol  tapering course 6-5-4-3-2-1 with a refill for when she travels, an EpiPen  for emergency use, and Diflucan  150  mg in case she develops Candida vaginitis per her request.  Assessment and Plan: [name] is a 63 y.o. female with: ***  Assessment and Plan               No follow-ups on file.  No orders of the defined types were placed in this encounter.  Lab Orders  No laboratory test(s) ordered today    Other allergy screening: Asthma: {Blank single:19197::yes,no} Rhino conjunctivitis: {Blank single:19197::yes,no} Food allergy: {Blank single:19197::yes,no} Medication allergy: {Blank single:19197::yes,no} Hymenoptera allergy: {Blank single:19197::yes,no} Urticaria: {Blank single:19197::yes,no} Eczema:{Blank single:19197::yes,no} History of recurrent infections suggestive of immunodeficency: {Blank single:19197::yes,no}  Diagnostics: Spirometry:  Tracings reviewed. Her effort: {Blank single:19197::Good reproducible efforts.,It was hard to get consistent efforts and there is a question as to whether this reflects a maximal maneuver.,Poor effort, data can not be interpreted.} FVC: ***L FEV1: ***L, ***% predicted FEV1/FVC ratio: ***% Interpretation: {Blank single:19197::Spirometry consistent with mild obstructive disease,Spirometry consistent with moderate obstructive disease,Spirometry consistent with severe obstructive disease,Spirometry consistent with possible restrictive disease,Spirometry consistent with mixed obstructive and restrictive disease,Spirometry uninterpretable due to technique,Spirometry consistent with normal pattern,No overt abnormalities noted given today's efforts}.  Please see scanned spirometry results for details.  Skin Testing: {Blank single:19197::Select foods,Environmental allergy panel,Environmental allergy panel and select foods,Food allergy panel,None,Deferred due to recent antihistamines use}. *** Results discussed with patient/family.   Past Medical History: Patient Active Problem List    Diagnosis Date Noted   Hyperlipidemia 12/19/2022   Right lateral epicondylitis 12/19/2022   Nasal congestion 12/19/2022   Spider veins of both  lower extremities 10/19/2015   Varicose veins of both lower extremities with complications 10/08/2015   Pain in shoulder 03/09/2014   Musculoskeletal chest pain 02/08/2012   Allergic rhinitis 08/28/2011   Hx of migraine headaches 08/13/2011   History of vitamin D  deficiency 08/13/2011   Past Medical History:  Diagnosis Date   Menopause    Migraine    Varicose veins    Vitamin D  deficiency    Past Surgical History: No past surgical history on file. Medication List:  Current Outpatient Medications  Medication Sig Dispense Refill   cholecalciferol (VITAMIN D ) 1000 UNITS tablet Take 1,000 Units by mouth daily.     Echinacea 125 MG CAPS Take 1 capsule by mouth every other day.     EPINEPHrine  (EPIPEN  2-PAK) 0.3 mg/0.3 mL IJ SOAJ injection Inject 0.3 mg into the muscle as needed for anaphylaxis. 1 each PRN   meloxicam  (MOBIC ) 15 MG tablet Take 1 tablet (15 mg total) by mouth daily. 90 tablet 0   methylPREDNISolone  (MEDROL ) 4 MG tablet Take in tapering course as directed 6-5-4-3-2-1 21 tablet 1   Multiple Vitamin (MULTIVITAMIN) capsule Take 1 capsule by mouth daily.     Probiotic Product ([employer] ADV PROBIOTIC GUMMIES) CHEW Chew by mouth.     rizatriptan  (MAXALT ) 10 MG tablet Take 1 tablet (10 mg total) by mouth as needed for migraine. May repeat in 2 hours if needed 10 tablet 1   rosuvastatin  (CRESTOR ) 5 MG tablet TAKE 1 TABLET(5 MG) BY MOUTH DAILY 90 tablet 3   Vitamin D , Ergocalciferol , (DRISDOL ) 1.25 MG (50000 UNIT) CAPS capsule TAKE 1 CAPSULE BY MOUTH 1 TIME A WEEK 12 capsule 3   No current facility-administered medications for this visit.   Allergies: No Known Allergies Social History: Social History   Socioeconomic History   Marital status: Single    Spouse name: Not on file   Number of children: 0   Years of education: Not on file    Highest education level: Not on file  Occupational History   Occupation: flight attendant    Employer: [employer]  Tobacco Use   Smoking status: Never   Smokeless tobacco: Never  Substance and Sexual Activity   Alcohol use: Yes    Alcohol/week: 0.0 standard drinks of alcohol   Drug use: No   Sexual activity: Not on file  Other Topics Concern   Not on file  Social History Narrative   Not on file   Social Drivers of Health   Financial Resource Strain: Not on file  Food Insecurity: Not on file  Transportation Needs: Not on file  Physical Activity: Not on file  Stress: Not on file  Social Connections: Not on file   Lives in a ***. Smoking: *** Occupation: ***  Environmental HistorySurveyor, minerals in the house: Network engineer in the family room: {Blank single:19197::yes,no} Carpet in the bedroom: {Blank single:19197::yes,no} Heating: {Blank single:19197::electric,gas,heat pump} Cooling: {Blank single:19197::central,window,heat pump} Pet: {Blank single:19197::yes ***,no}  Family History: Family History  Problem Relation Age of Onset   Heart disease Father    Diabetes Father    Diabetes Brother    Breast cancer Maternal Aunt    Problem                               Relation Asthma                                   ***  Eczema                                *** Food allergy                          *** Allergic rhino conjunctivitis     ***  Review of Systems  Constitutional:  Negative for appetite change, chills, fever and unexpected weight change.  HENT:  Negative for congestion and rhinorrhea.   Eyes:  Negative for itching.  Respiratory:  Negative for cough, chest tightness, shortness of breath and wheezing.   Cardiovascular:  Negative for chest pain.  Gastrointestinal:  Negative for abdominal pain.  Genitourinary:  Negative for difficulty urinating.  Skin:  Negative for rash.   Neurological:  Negative for headaches.    Objective: LMP 06/05/2010  There is no height or weight on file to calculate BMI. Physical Exam Vitals and nursing note reviewed.  Constitutional:      Appearance: Normal appearance. She is well-developed.  HENT:     Head: Normocephalic and atraumatic.     Right Ear: Tympanic membrane and external ear normal.     Left Ear: Tympanic membrane and external ear normal.     Nose: Nose normal.     Mouth/Throat:     Mouth: Mucous membranes are moist.     Pharynx: Oropharynx is clear.  Eyes:     Conjunctiva/sclera: Conjunctivae normal.  Cardiovascular:     Rate and Rhythm: Normal rate and regular rhythm.     Heart sounds: Normal heart sounds. No murmur heard.    No friction rub. No gallop.  Pulmonary:     Effort: Pulmonary effort is normal.     Breath sounds: Normal breath sounds. No wheezing, rhonchi or rales.  Musculoskeletal:     Cervical back: Neck supple.  Skin:    General: Skin is warm.     Findings: No rash.  Neurological:     Mental Status: She is alert and oriented to person, place, and time.  Psychiatric:        Behavior: Behavior normal.    The plan was reviewed with the patient/family, and all questions/concerned were addressed.  It was my pleasure to see Zeena today and participate in her care. Please feel free to contact me with any questions or concerns.  Sincerely,  Orlan Cramp, DO Allergy & Immunology  [facility]  [facility] office: [phone] [facility] office: [phone]

## 2024-02-01 ENCOUNTER — Ambulatory Visit (INDEPENDENT_AMBULATORY_CARE_PROVIDER_SITE_OTHER): Admitting: Allergy

## 2024-02-01 ENCOUNTER — Other Ambulatory Visit: Payer: Self-pay

## 2024-02-01 ENCOUNTER — Encounter: Payer: Self-pay | Admitting: Allergy

## 2024-02-01 VITALS — BP 110/70 | HR 71 | Temp 97.9°F | Resp 16 | Ht 63.0 in | Wt 158.8 lb

## 2024-02-01 DIAGNOSIS — T783XXD Angioneurotic edema, subsequent encounter: Secondary | ICD-10-CM

## 2024-02-01 DIAGNOSIS — T783XXA Angioneurotic edema, initial encounter: Secondary | ICD-10-CM

## 2024-02-01 DIAGNOSIS — T781XXA Other adverse food reactions, not elsewhere classified, initial encounter: Secondary | ICD-10-CM

## 2024-02-01 DIAGNOSIS — T781XXD Other adverse food reactions, not elsewhere classified, subsequent encounter: Secondary | ICD-10-CM

## 2024-02-01 DIAGNOSIS — T7840XA Allergy, unspecified, initial encounter: Secondary | ICD-10-CM

## 2024-02-01 DIAGNOSIS — T7840XD Allergy, unspecified, subsequent encounter: Secondary | ICD-10-CM

## 2024-02-01 NOTE — Patient Instructions (Addendum)
 Allergic reactions/tingling/swelling.  Keep track of episodes and take pictures. Write down what you had done/eaten during flares.  Etiology unclear. Avoid NSAIDs for now. Meloxicam , Advil, aleve, aspirin, ibuprofen. See list.  Take Tylenol as needed for pain. Avoid pineapple, grapes, citrus fruits. Start taking zyrtec 10mg  one a day at night as a preventative treatment while we work this up.   Get bloodwork. We are ordering labs, so please allow 1-2 weeks for the results to come back. With the newly implemented Cures Act, the labs might be visible to you at the same time that they become visible to me. However, I will not address the results until all of the results are back, so please be patient.  In the meantime, continue recommendations in your patient instructions, including avoidance measures (if applicable), until you hear from me.  For mild symptoms you can take over the counter antihistamines (zyrtec 10mg  to 20mg ) and monitor symptoms closely.  If symptoms worsen or if you have severe symptoms including breathing issues, throat closure, significant swelling, whole body hives, severe diarrhea and vomiting, lightheadedness then use epinephrine  and seek immediate medical care afterwards. Emergency action plan given.  Follow up in 6 weeks or sooner if needed.

## 2024-02-04 LAB — PROTEIN ELECTROPHORESIS, URINE REFLEX

## 2024-02-07 NOTE — Progress Notes (Unsigned)
 Follow Up Note  RE: Carrie Mosley MRN: [id number] DOB: 01-07-1961 Date of Office Visit: 02/08/2024  Referring provider: [name], MD Primary care provider: [name], MD  Chief Complaint: No chief complaint on file.  History of Present Illness: I had the pleasure of seeing Carrie Mosley for a follow up visit at the [facility] on 02/08/2024. She is a 63 y.o. female, who is being followed for adverse food reactions, angioedema versus allergic reactions. Her previous allergy office visit was on 02/01/2024 with Dr. Luke. Today is a new complaint visit of discuss lab results.  Discussed the use of AI scribe software for clinical note transcription with the patient, who gave verbal consent to proceed.  History of Present Illness            1 labs still pending  Assessment and Plan: Carrie Mosley is a 63 y.o. 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 No specific triggers identified but concned for NSAIDs, grapes and citrus fruits. Patient hesitant about taking daily antihistamines.  Keep track of episodes and take pictures. Write down what you had done/eaten during flares.  Etiology unclear. Avoid NSAIDs for now. Meloxicam , Advil, aleve, aspirin, ibuprofen. See list.  Take Tylenol as needed for pain. Avoid pineapple, grapes, citrus fruits. Start taking zyrtec 10mg  one a day at night as a preventative treatment while we work this up.  Get bloodwork. If negative will consider patch testing and skin testing next.  For mild symptoms you can take over the counter antihistamines (zyrtec 10mg  to 20mg ) and monitor symptoms closely.  If symptoms worsen or if you have severe symptoms including breathing issues, throat closure, significant swelling, whole body hives, severe diarrhea and vomiting, lightheadedness then use epinephrine  and seek  immediate medical care afterwards. Emergency action plan given. Assessment and Plan              No follow-ups on file.  No orders of the defined types were placed in this encounter.  Lab Orders  No laboratory test(s) ordered today    Diagnostics: Spirometry:  Tracings reviewed. Her effort: {Blank single:19197::Good reproducible efforts.,It was hard to get consistent efforts and there is a question as to whether this reflects a maximal maneuver.,Poor effort, data can not be interpreted.} FVC: ***L FEV1: ***L, ***% predicted FEV1/FVC ratio: ***% Interpretation: {Blank single:19197::Spirometry consistent with mild obstructive disease,Spirometry consistent with moderate obstructive disease,Spirometry consistent with severe obstructive disease,Spirometry consistent with possible restrictive disease,Spirometry consistent with mixed obstructive and restrictive disease,Spirometry uninterpretable due to technique,Spirometry consistent with normal pattern,No overt abnormalities noted given today's efforts}.  Please see scanned spirometry results for details.  Skin Testing: {Blank single:19197::Select foods,Environmental allergy panel,Environmental allergy panel and select foods,Food allergy panel,None,Deferred due to recent antihistamines use}. *** Results discussed with patient/family.   Medication List:  Current Outpatient Medications  Medication Sig Dispense Refill  . Echinacea 125 MG CAPS Take 1 capsule by mouth every other day.    . EPINEPHrine  (EPIPEN  2-PAK) 0.3 mg/0.3 mL IJ SOAJ injection Inject 0.3 mg into the muscle as needed for anaphylaxis. 1 each PRN  . Multiple Vitamin (MULTIVITAMIN) capsule Take 1 capsule by mouth daily.    . Probiotic Product ([employer] ADV PROBIOTIC GUMMIES) CHEW Chew by mouth.    . rizatriptan  (MAXALT ) 10 MG tablet Take 1 tablet (10 mg total) by mouth as needed for migraine. May repeat in 2 hours  if needed 10 tablet 1  .  rosuvastatin  (CRESTOR ) 5 MG tablet TAKE 1 TABLET(5 MG) BY MOUTH DAILY 90 tablet 3  . Vitamin D , Ergocalciferol , (DRISDOL ) 1.25 MG (50000 UNIT) CAPS capsule TAKE 1 CAPSULE BY MOUTH 1 TIME A WEEK 12 capsule 3   No current facility-administered medications for this visit.   Allergies: No Known Allergies I reviewed her past medical history, social history, family history, and environmental history and no significant changes have been reported from her previous visit.  Review of Systems  Constitutional:  Negative for appetite change, chills, fever and unexpected weight change.  HENT:  Negative for congestion and rhinorrhea.   Eyes:  Negative for itching.  Respiratory:  Negative for cough, chest tightness, shortness of breath and wheezing.   Cardiovascular:  Negative for chest pain.  Gastrointestinal:  Negative for abdominal pain.  Genitourinary:  Negative for difficulty urinating.  Skin:  Negative for rash.  Neurological:  Negative for headaches.    Objective: LMP 06/05/2010  There is no height or weight on file to calculate BMI. Physical Exam Vitals and nursing note reviewed.  Constitutional:      Appearance: Normal appearance. She is well-developed.  HENT:     Head: Normocephalic and atraumatic.     Right Ear: Tympanic membrane and external ear normal.     Left Ear: Tympanic membrane and external ear normal.     Nose: Nose normal.     Mouth/Throat:     Mouth: Mucous membranes are moist.     Pharynx: Oropharynx is clear.  Eyes:     Conjunctiva/sclera: Conjunctivae normal.  Cardiovascular:     Rate and Rhythm: Normal rate and regular rhythm.     Heart sounds: Normal heart sounds. No murmur heard.    No friction rub. No gallop.  Pulmonary:     Effort: Pulmonary effort is normal.     Breath sounds: Normal breath sounds. No wheezing, rhonchi or rales.  Musculoskeletal:     Cervical back: Neck supple.  Skin:    General: Skin is warm.     Findings: No rash.  Neurological:      Mental Status: She is alert and oriented to person, place, and time.  Psychiatric:        Behavior: Behavior normal.   Previous notes and tests were reviewed. The plan was reviewed with the patient/family, and all questions/concerned were addressed.  It was my pleasure to see Carrie Mosley today and participate in her care. Please feel free to contact me with any questions or concerns.  Sincerely,  Orlan Cramp, DO Allergy & Immunology  [facility] office: [phone] [facility] office: [phone]

## 2024-02-08 ENCOUNTER — Ambulatory Visit: Payer: Self-pay | Admitting: Allergy

## 2024-02-08 ENCOUNTER — Ambulatory Visit (INDEPENDENT_AMBULATORY_CARE_PROVIDER_SITE_OTHER): Admitting: Allergy

## 2024-02-08 ENCOUNTER — Encounter: Payer: Self-pay | Admitting: Allergy

## 2024-02-08 ENCOUNTER — Other Ambulatory Visit: Payer: Self-pay

## 2024-02-08 VITALS — BP 102/84 | HR 78 | Temp 97.6°F | Resp 18 | Ht 65.0 in | Wt 158.1 lb

## 2024-02-08 DIAGNOSIS — T781XXD Other adverse food reactions, not elsewhere classified, subsequent encounter: Secondary | ICD-10-CM | POA: Diagnosis not present

## 2024-02-08 DIAGNOSIS — T7840XD Allergy, unspecified, subsequent encounter: Secondary | ICD-10-CM | POA: Diagnosis not present

## 2024-02-08 DIAGNOSIS — T783XXD Angioneurotic edema, subsequent encounter: Secondary | ICD-10-CM | POA: Diagnosis not present

## 2024-02-08 NOTE — Patient Instructions (Addendum)
 Allergic reactions/tingling/swelling.  So far your labs look all negative.  Keep track of episodes and take pictures. Write down what you had done/eaten during flares.  Etiology unclear. Avoid NSAIDs for now. Take Tylenol as needed for pain. Start taking zyrtec 10mg  one a day at night as a preventative treatment while we work this up.   Reintroduce grapes into your diet. Pineapple and citrus fruits sometimes can irritate the throat so I would be more careful about reintroducing these foods back into the diet.   For mild symptoms you can take over the counter antihistamines (zyrtec 10mg  to 20mg ) and monitor symptoms closely.  If symptoms worsen or if you have severe symptoms including breathing issues, throat closure, significant swelling, whole body hives, severe diarrhea and vomiting, lightheadedness then use epinephrine  and seek immediate medical care afterwards. Emergency action plan in place.   We can also do patch testing in the future if interested - this tests for chemicals found in common household and personal care products. Patches are best placed on Monday with return to office on Wednesday and Friday of same week for readings.  Patches once placed should not get wet.  You do not have to stop any medications for patch testing but should not be on oral prednisone . You can schedule a patch testing visit when convenient for your schedule.    I don't think you need the allergy skin testing done as your bloodwork showed no allergic cells.  Follow up depending in lab results.   Skin care recommendations  Bath time: Always use lukewarm water. AVOID very hot or cold water. Keep bathing time to 5-10 minutes. Do NOT use bubble bath. Use a mild soap and use just enough to wash the dirty areas. Do NOT scrub skin vigorously.  After bathing, pat dry your skin with a towel. Do NOT rub or scrub the skin.  Moisturizers and prescriptions:  ALWAYS apply moisturizers immediately after  bathing (within 3 minutes). This helps to lock-in moisture. Use the moisturizer several times a day over the whole body. Good summer moisturizers include: Aveeno, CeraVe, Cetaphil. Good winter moisturizers include: Aquaphor, Vaseline, Cerave, Cetaphil, Eucerin, Vanicream. When using moisturizers along with medications, the moisturizer should be applied about one hour after applying the medication to prevent diluting effect of the medication or moisturize around where you applied the medications. When not using medications, the moisturizer can be continued twice daily as maintenance.  Laundry and clothing: Avoid laundry products with added color or perfumes. Use unscented hypo-allergenic laundry products such as Tide free, Cheer free & gentle, and All free and clear.  If the skin still seems dry or sensitive, you can try double-rinsing the clothes. Avoid tight or scratchy clothing such as wool. Do not use fabric softeners or dyer sheets.

## 2024-02-10 LAB — CBC WITH DIFFERENTIAL/PLATELET
Basophils Absolute: 0 x10E3/uL (ref 0.0–0.2)
Basos: 1 %
EOS (ABSOLUTE): 0.1 x10E3/uL (ref 0.0–0.4)
Eos: 3 %
Hematocrit: 44.5 % (ref 34.0–46.6)
Hemoglobin: 14.9 g/dL (ref 11.1–15.9)
Immature Grans (Abs): 0 x10E3/uL (ref 0.0–0.1)
Immature Granulocytes: 0 %
Lymphocytes Absolute: 1.2 x10E3/uL (ref 0.7–3.1)
Lymphs: 35 %
MCH: 28.3 pg (ref 26.6–33.0)
MCHC: 33.5 g/dL (ref 31.5–35.7)
MCV: 85 fL (ref 79–97)
Monocytes Absolute: 0.3 x10E3/uL (ref 0.1–0.9)
Monocytes: 8 %
Neutrophils Absolute: 1.9 x10E3/uL (ref 1.4–7.0)
Neutrophils: 53 %
Platelets: 213 x10E3/uL (ref 150–450)
RBC: 5.26 x10E6/uL (ref 3.77–5.28)
RDW: 12.8 % (ref 11.7–15.4)
WBC: 3.5 x10E3/uL (ref 3.4–10.8)

## 2024-02-10 LAB — COMPREHENSIVE METABOLIC PANEL WITH GFR
ALT: 12 IU/L (ref 0–32)
AST: 17 IU/L (ref 0–40)
Albumin: 4.5 g/dL (ref 3.9–4.9)
Alkaline Phosphatase: 144 IU/L — ABNORMAL HIGH (ref 44–121)
BUN/Creatinine Ratio: 10 — ABNORMAL LOW (ref 12–28)
BUN: 9 mg/dL (ref 8–27)
Bilirubin Total: 0.8 mg/dL (ref 0.0–1.2)
CO2: 23 mmol/L (ref 20–29)
Calcium: 10.1 mg/dL (ref 8.7–10.3)
Chloride: 101 mmol/L (ref 96–106)
Creatinine, Ser: 0.88 mg/dL (ref 0.57–1.00)
Globulin, Total: 3.2 g/dL (ref 1.5–4.5)
Glucose: 82 mg/dL (ref 70–99)
Potassium: 3.9 mmol/L (ref 3.5–5.2)
Sodium: 140 mmol/L (ref 134–144)
Total Protein: 7.7 g/dL (ref 6.0–8.5)
eGFR: 75 mL/min/1.73 (ref >59)

## 2024-02-10 LAB — C3 AND C4
Complement C3, Serum: 178 mg/dL — ABNORMAL HIGH (ref 82–167)
Complement C4, Serum: 64 mg/dL — ABNORMAL HIGH (ref 12–38)

## 2024-02-10 LAB — COMPLEMENT COMPONENT C1Q: Complement C1Q: 19.2 mg/dL (ref 10.3–20.5)

## 2024-02-10 LAB — THYROID CASCADE PROFILE: TSH: 0.839 u[IU]/mL (ref 0.450–4.500)

## 2024-02-10 LAB — ALLERGEN GRAPE F259: Allergen Grape IgE: <0.1 kU/L

## 2024-02-10 LAB — ALLERGEN PROFILE, FOOD-CITRUS
Allergen Grapefruit IgE: <0.1 kU/L
Allergen Lime IgE: <0.1 kU/L
Lemon: <0.1 kU/L
Orange: <0.1 kU/L
Tangerine IgE: <0.1 kU/L

## 2024-02-10 LAB — ALPHA-GAL PANEL
Allergen Lamb IgE: <0.1 kU/L
Beef IgE: <0.1 kU/L
IgE (Immunoglobulin E), Serum: 2 [IU]/mL — ABNORMAL LOW (ref 6–495)
O215-IgE Alpha-Gal: <0.1 kU/L
Pork IgE: <0.1 kU/L

## 2024-02-10 LAB — PROTEIN ELECTROPHORESIS, SERUM
A/G Ratio: 1 (ref 0.7–1.7)
Albumin ELP: 3.9 g/dL (ref 2.9–4.4)
Alpha 1: 0.3 g/dL (ref 0.0–0.4)
Alpha 2: 1 g/dL (ref 0.4–1.0)
Beta: 1.2 g/dL (ref 0.7–1.3)
Gamma Globulin: 1.3 g/dL (ref 0.4–1.8)
Globulin, Total: 3.8 g/dL (ref 2.2–3.9)

## 2024-02-10 LAB — C1 ESTERASE INHIBITOR, FUNCTIONAL: C1INH Functional/C1INH Total MFr SerPl: >105 %{normal}

## 2024-02-10 LAB — PROTEIN ELECTROPHORESIS, URINE REFLEX
Albumin ELP, Urine: 36.4 %
Alpha-1-Globulin, U: 3.6 %
Alpha-2-Globulin, U: 19.6 %
Beta Globulin, U: 28.5 %
Gamma Globulin, U: 11.9 %
Protein, Ur: 11.5 mg/dL

## 2024-02-10 LAB — TRYPTASE: Tryptase: 7.9 ug/L (ref 2.2–13.2)

## 2024-02-10 LAB — SEDIMENTATION RATE: Sed Rate: 39 mm/h (ref 0–40)

## 2024-02-10 LAB — ANTINUCLEAR ANTIBODIES, IFA: ANA Titer 1: NEGATIVE

## 2024-02-10 LAB — C1 ESTERASE INHIBITOR: C1INH SerPl-mCnc: 27 mg/dL (ref 21–39)

## 2024-02-10 LAB — ALLERGEN, PINEAPPLE, F210: Pineapple IgE: <0.1 kU/L

## 2024-03-01 ENCOUNTER — Other Ambulatory Visit: Payer: Self-pay | Admitting: Internal Medicine

## 2024-05-06 ENCOUNTER — Encounter: Payer: Self-pay | Admitting: Internal Medicine

## 2024-05-06 ENCOUNTER — Ambulatory Visit: Admitting: Internal Medicine

## 2024-05-06 DIAGNOSIS — Z7184 Encounter for health counseling related to travel: Secondary | ICD-10-CM

## 2024-05-06 DIAGNOSIS — Z7729 Contact with and (suspected ) exposure to other hazardous substances: Secondary | ICD-10-CM | POA: Diagnosis not present

## 2024-05-06 MED ORDER — ONDANSETRON HCL 4 MG PO TABS: 4.0000 mg | ORAL_TABLET | Freq: Three times a day (TID) | ORAL | 0 refills | Status: DC | PRN

## 2024-05-06 MED ORDER — FLUCONAZOLE 150 MG PO TABS: 150.0000 mg | ORAL_TABLET | Freq: Once | ORAL | 0 refills | Status: AC

## 2024-05-06 MED ORDER — CIPROFLOXACIN HCL 500 MG PO TABS: 500.0000 mg | ORAL_TABLET | Freq: Two times a day (BID) | ORAL | 0 refills | Status: AC

## 2024-05-06 NOTE — Progress Notes (Shared)
 Patient Care Team: [name], MD as PCP - General (Internal Medicine)  Visit Date: 05/06/24  Subjective:    Patient ID: Carrie Mosley , Female   DOB: 1961/03/18, 63 y.o.    MRN: [id number]   63 y.o. Female presents today for Toxic inhalation. Patient has a past medical history of Migraine headaches, Hyperlipidemia, Vitamin D  deficiency.  Last week Wednesday, She was exposed to carbon monoxide at work. 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  She said that afterwards she had a headache and was tired and irritable. She is unsure how long she was exposed to carbon monoxide. Some of her coworkers also had headaches. She said that she no has a headache.    She is going to [place] next year and wanted medication in case she fell ill while abroad.    Vaccine counseling: Influenza and Covid-19 vaccines due.   Past Medical History:  Diagnosis Date   Angio-edema    Menopause    Migraine    Urticaria    Varicose veins    Vitamin D  deficiency      Family History  Problem Relation Age of Onset   Heart disease Father    Diabetes Father    Diabetes Brother    Breast cancer Maternal Aunt    Mother died of complications of Diabetes with Septicemia.  Father died of heart failure in his 94s with history of Diabetes and Coronary Artery Disease. No sisters.     Social history Narrative:  Single, never married. No children. Rarely drinks alcohol. Does not smoke. 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      Review of Systems  All other systems reviewed and are negative.       Objective:   Vitals: BP 102/70   Pulse 88   Temp 98.3 F (36.8 C)   Ht 5' 5 (1.651 m)   Wt 159 lb (72.1 kg)   LMP 06/05/2010   SpO2 98%   BMI 26.46 kg/m    Physical Exam Vitals and nursing note reviewed.  Constitutional:       General: She is not in acute distress.    Appearance: Normal appearance. She is not toxic-appearing.  HENT:     Head: Normocephalic and atraumatic.  Cardiovascular:     Rate and Rhythm: Normal rate and regular rhythm. No extrasystoles are present.    Pulses: Normal pulses.     Heart sounds: Normal heart sounds. No murmur heard.    No friction rub. No gallop.  Pulmonary:     Effort: Pulmonary effort is normal. No respiratory distress.     Breath sounds: Normal breath sounds. No wheezing or rales.  Skin:    General: Skin is warm and dry.  Neurological:     Mental Status: She is alert and oriented to person, place, and time. Mental status is at baseline.  Psychiatric:        Mood and Affect: Mood normal.        Behavior: Behavior normal.        Thought Content: Thought content normal.        Judgment: Judgment normal.       Results:     Labs:       Component Value Date/Time   NA 140 02/01/2024  1446   K 3.9 02/01/2024 1446   CL 101 02/01/2024 1446   CO2 23 02/01/2024 1446   GLUCOSE 82 02/01/2024 1446   GLUCOSE 81 06/22/2023 1008   BUN 9 02/01/2024 1446   CREATININE 0.88 02/01/2024 1446   CREATININE 0.86 06/22/2023 1008   CALCIUM  10.1 02/01/2024 1446   PROT 7.7 02/01/2024 1446   ALBUMIN 4.5 02/01/2024 1446   AST 17 02/01/2024 1446   ALT 12 02/01/2024 1446   ALKPHOS 144 (H) 02/01/2024 1446   BILITOT 0.8 02/01/2024 1446   GFRNONAA >60 03/21/2021 1445   GFRNONAA 80 04/07/2019 1000   GFRAA 93 04/07/2019 1000     Lab Results  Component Value Date   WBC 3.5 02/01/2024   HGB 14.9 02/01/2024   HCT 44.5 02/01/2024   MCV 85 02/01/2024   PLT 213 02/01/2024    Lab Results  Component Value Date   CHOL 185 06/22/2023   HDL 76 06/22/2023   LDLCALC 94 06/22/2023   TRIG 66 06/22/2023   CHOLHDL 2.4 06/22/2023     Lab Results  Component Value Date   TSH 0.839 02/01/2024        Assessment & Plan:   Meds ordered this encounter  Medications   DISCONTD:  ondansetron  (ZOFRAN ) 4 MG tablet    Sig: Take 1 tablet (4 mg total) by mouth every 8 (eight) hours as needed for nausea or vomiting.    Dispense:  20 tablet    Refill:  0   ondansetron  (ZOFRAN ) 4 MG tablet    Sig: Take 1 tablet (4 mg total) by mouth every 8 (eight) hours as needed for nausea or vomiting.    Dispense:  20 tablet    Refill:  0   fluconazole  (DIFLUCAN ) 150 MG tablet    Sig: Take 1 tablet (150 mg total) by mouth once for 1 dose.    Dispense:  1 tablet    Refill:  0   ciprofloxacin  (CIPRO ) 500 MG tablet    Sig: Take 1 tablet (500 mg total) by mouth 2 (two) times daily for 10 days. May take for UTI or food poisoning on trip to [place]    Dispense:  20 tablet    Refill:  0    Toxic Inhalation: Last week Wednesday, She was exposed to carbon monoxide at work. 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  She said that afterwards she had a headache and was tired and irritable. She is unsure how long she was exposed to carbon monoxide. Some of her coworkers also had headaches. She said that she no longer has a headache.    Travel Advice: She is going to [place] next year and wanted medication in case she fell ill while abroad.   Zofran  4 mg every 8 hours as needed, Diflucan  150 mg, Cipro  500 mg twice daily for food poisoning prescribed.     Vaccine counseling: Influenza and Covid-19 vaccines due.     I,Makayla C Reid,acting as a scribe for Ronal JINNY Hailstone, MD.,have documented all relevant documentation on the behalf of Ronal JINNY Hailstone, MD,as directed by  Ronal JINNY Hailstone, MD while in the presence of Ronal JINNY Hailstone, MD.

## 2024-05-14 ENCOUNTER — Telehealth: Payer: Self-pay

## 2024-05-16 NOTE — Telephone Encounter (Signed)
 Error

## 2024-05-19 ENCOUNTER — Other Ambulatory Visit (HOSPITAL_BASED_OUTPATIENT_CLINIC_OR_DEPARTMENT_OTHER): Payer: Self-pay | Admitting: Obstetrics and Gynecology

## 2024-05-19 DIAGNOSIS — M8580 Other specified disorders of bone density and structure, unspecified site: Secondary | ICD-10-CM

## 2024-05-24 NOTE — Patient Instructions (Addendum)
 We have examined you today. Do not see any after effects from carbon monoxide exposure.It would have been beneficial to have been urgently elevated but circumstances did not permit this. She seems fine today. Also discussed upcoming travel to [place] and meds to take/consider as well as checking on vaccines. May need appt with Infectious Disease clinic for travel advice.

## 2024-05-26 ENCOUNTER — Telehealth: Payer: Self-pay | Admitting: Internal Medicine

## 2024-05-26 ENCOUNTER — Other Ambulatory Visit: Payer: Self-pay | Admitting: Internal Medicine

## 2024-05-26 DIAGNOSIS — Z1231 Encounter for screening mammogram for malignant neoplasm of breast: Secondary | ICD-10-CM

## 2024-05-26 NOTE — Telephone Encounter (Signed)
 Done

## 2024-06-26 ENCOUNTER — Other Ambulatory Visit

## 2024-06-27 ENCOUNTER — Encounter: Admitting: Internal Medicine

## 2024-06-27 ENCOUNTER — Ambulatory Visit: Admitting: Internal Medicine

## 2024-06-27 ENCOUNTER — Encounter: Payer: Self-pay | Admitting: Internal Medicine

## 2024-06-27 VITALS — BP 120/70 | HR 84 | Temp 98.1°F | Ht 65.0 in | Wt 159.0 lb

## 2024-06-27 DIAGNOSIS — J0100 Acute maxillary sinusitis, unspecified: Secondary | ICD-10-CM | POA: Diagnosis not present

## 2024-06-27 DIAGNOSIS — Z789 Other specified health status: Secondary | ICD-10-CM

## 2024-06-27 MED ORDER — AZITHROMYCIN 250 MG PO TABS: ORAL_TABLET | ORAL | 1 refills | Status: AC

## 2024-06-27 MED ORDER — CEFTRIAXONE SODIUM 1 G IJ SOLR
1.0000 g | Freq: Once | INTRAMUSCULAR | Status: AC
  Administered 2024-06-27: 1 g via INTRAMUSCULAR

## 2024-06-27 MED ORDER — METHYLPREDNISOLONE ACETATE 80 MG/ML IJ SUSP
80.0000 mg | Freq: Once | INTRAMUSCULAR | Status: AC
  Administered 2024-06-27: 80 mg via INTRAMUSCULAR

## 2024-06-27 MED ORDER — FLUCONAZOLE 150 MG PO TABS: 150.0000 mg | ORAL_TABLET | Freq: Once | ORAL | 0 refills | Status: AC

## 2024-06-27 NOTE — Progress Notes (Shared)
 "  Annual Comprehensive Physical Exam    Patient Care Team: [name], MD as PCP - General (Internal Medicine)  Visit Date: 07/11/24   Chief Complaint  Patient presents with   Annual Exam    Patient states she had a pap smear in the fall 2025 at Dr. Rutherford office, I have requested records.    Subjective:  Patient: Carrie Mosley, Female DOB: 1960-06-15, 64 y.o. MRN: [id number] Vitals:   07/11/24 1101  BP: 110/70   Carrie Mosley is a 64 y.o. Female who presents today for her  Annual Comprehensive Physical Exam. Patient has Hx of migraine headaches; History of vitamin D  deficiency; Allergic rhinitis; Musculoskeletal chest pain; Pain in shoulder; Varicose veins of both lower extremities with complications; Spider veins of both lower extremities; Hyperlipidemia; Right lateral epicondylitis; and Nasal congestion on their problem list.  Was last seen 06/27/2024 for Acute Non-recurrent Maxillary sinusitis. She said that she still has some rhinorrhea and her voice is still hoarse, but she said she feels better. Denies cough.   History of Migraine Headaches treated with 10 mg Maxalt  PRN.   History of Hyperlipidemia treated with 5 mg Rosuvastatin  daily. 07/10/2024 Lipid panel.   History of Vitamin D  deficiency treated with Dridol 1.25 mg weekly.    History of Allergic rhinitis, Was seen by Dr. Orlan on 02/08/2024. Blood (CBC diff, CMP, TSH, ANA, ESR, C1 inh Level and function, Tryptase, Alpha gal, SPEP, UPEPE) tests were normal.   Labs 07/10/2024 WBC 3.1, RBC 5.18, Otherwise WNL.   06/29/2023 Mammogram No mammographic evidence of malignancy. Repeat in one year.    07/04/2023 Colonoscopy The entire examined colon is normal. The examined portion of the ileum was normal. No specimens collected. Repeat in 10 years.    Health Maintenance: Mammogram scheduled on Monday.   Health Maintenance  Topic Date Due   Bone Density Scan  09/06/2023   Cervical Cancer Screening (HPV/Pap Cotest)   04/07/2024   Mammogram  06/28/2025   DTaP/Tdap/Td (3 - Td or Tdap) 06/18/2031   Colonoscopy  07/03/2033   Pneumococcal Vaccine: 50+ Years  Completed   Influenza Vaccine  Completed   HPV VACCINES (No Doses Required) Completed   COVID-19 Vaccine  Completed   Zoster Vaccines- Shingrix  Completed   Hepatitis B Vaccines 19-59 Average Risk  Aged Out   Meningococcal B Vaccine  Aged Out   Hepatitis C Screening  Discontinued   HIV Screening  Discontinued    Review of Systems  Constitutional:  Negative for fever and malaise/fatigue.  HENT:  Negative for congestion.   Eyes:  Negative for blurred vision.  Respiratory:  Negative for cough and shortness of breath.   Cardiovascular:  Negative for chest pain, palpitations and leg swelling.  Gastrointestinal:  Negative for vomiting.  Musculoskeletal:  Negative for back pain.  Skin:  Negative for rash.  Neurological:  Negative for loss of consciousness and headaches.   Objective:  Vitals: body mass index is 26.79 kg/m. Today's Vitals   07/11/24 1101  BP: 110/70  Pulse: 75  SpO2: 95%  Weight: 161 lb (73 kg)  Height: 5' 5 (1.651 m)   Physical Exam Vitals and nursing note reviewed.  Constitutional:      General: She is not in acute distress.    Appearance: Normal appearance. She is not ill-appearing or toxic-appearing.  HENT:     Head: Normocephalic and atraumatic.     Right Ear: Hearing, tympanic membrane, ear canal and external ear normal.  Left Ear: Hearing, tympanic membrane, ear canal and external ear normal.     Mouth/Throat:     Pharynx: Oropharynx is clear.  Eyes:     Extraocular Movements: Extraocular movements intact.     Pupils: Pupils are equal, round, and reactive to light.  Neck:     Thyroid : No thyroid  mass, thyromegaly or thyroid  tenderness.     Vascular: No carotid bruit.  Cardiovascular:     Rate and Rhythm: Normal rate and regular rhythm. No extrasystoles are present.    Pulses:          Dorsalis pedis  pulses are 2+ on the right side and 2+ on the left side.     Heart sounds: Normal heart sounds. No murmur heard.    No friction rub. No gallop.  Pulmonary:     Effort: Pulmonary effort is normal.     Breath sounds: Normal breath sounds. No decreased breath sounds, wheezing, rhonchi or rales.  Chest:     Chest wall: No mass.  Abdominal:     Palpations: Abdomen is soft. There is no hepatomegaly, splenomegaly or mass.     Tenderness: There is no abdominal tenderness.     Hernia: No hernia is present.  Musculoskeletal:     Cervical back: Normal range of motion.     Right lower leg: No edema.     Left lower leg: No edema.  Lymphadenopathy:     Cervical: No cervical adenopathy.     Upper Body:     Right upper body: No supraclavicular adenopathy.     Left upper body: No supraclavicular adenopathy.  Skin:    General: Skin is warm and dry.  Neurological:     General: No focal deficit present.     Mental Status: She is alert and oriented to person, place, and time. Mental status is at baseline.     Sensory: Sensation is intact.     Motor: Motor function is intact. No weakness.     Deep Tendon Reflexes: Reflexes are normal and symmetric.  Psychiatric:        Attention and Perception: Attention normal.        Mood and Affect: Mood normal.        Speech: Speech normal.        Behavior: Behavior normal.        Thought Content: Thought content normal.        Cognition and Memory: Cognition normal.        Judgment: Judgment normal.     Current Outpatient Medications  Medication Instructions   Echinacea 125 MG CAPS 1 capsule, Every other day   EPINEPHrine  (EPIPEN  2-PAK) 0.3 mg, Intramuscular, As needed   Multiple Vitamin (MULTIVITAMIN) capsule 1 capsule, Daily   Probiotic Product ([employer] ADV PROBIOTIC GUMMIES) CHEW Chew by mouth.   rizatriptan  (MAXALT ) 10 mg, Oral, As needed, May repeat in 2 hours if needed   rosuvastatin  (CRESTOR ) 5 MG tablet TAKE 1 TABLET(5 MG) BY MOUTH DAILY   Vitamin  D, Ergocalciferol , (DRISDOL ) 1.25 MG (50000 UNIT) CAPS capsule TAKE 1 CAPSULE BY MOUTH 1 TIME A WEEK   Past Medical History:  Diagnosis Date   Allergy    Angio-edema    Menopause    Migraine    Urticaria    Varicose veins    Vitamin D  deficiency    Medical/Surgical History Narrative:  Allergic/Intolerant to: Allergies[1] She had a CT of the abdomen when she was seen in the Emergency Department by Dr.  Belfi in 2022. found no etiology on CT of abdomen and pelvis to explain her pain. Was found to have a calcified uterine fibroid on the left.   Became menopausal in 2011.  History of vitamin D  deficiency.  Likely has lactose intolerance.  History of recurrent bacterial vaginosis but no episodes in some time.  History of occasional migraine headaches but these have lessened as she has gotten older.  History of kidney stone in 1999.  History of benign cardiac murmur.  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  She saw Dr. Frederik very in April 2012 when he felt she had eustachian tube dysfunction.  She was allergy tested in 2003 and there was no hypersensitivity to a screening panel of allergens.  At that time CT of the sinuses was obtained showing no sinus disease.  No past surgical history on file. Family History  Problem Relation Age of Onset   Heart disease Father    Diabetes Father    Diabetes Brother    Breast cancer Maternal Aunt    Family History Narrative: Mother died of complications of Diabetes with Septicemia.  Father died of heart failure in his 49s with history of Diabetes and Coronary Artery Disease. No sisters.   Social History Narrative:  Single, never married. No children. Rarely drinks alcohol. Does not smoke. 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    06/22/23 - Recently went to [place].   Most Recent Health Risks Assessment:   Most Recent Social Determinants of Health (Including Hx of Tobacco, Alcohol, and Drug Use) SDOH Screenings   Housing: Unknown (07/07/2024)  Transportation Needs: No Transportation Needs (07/07/2024)  Alcohol Screen: Low Risk (07/07/2024)  Depression (PHQ2-9): Low Risk (06/27/2024)  Physical Activity: Insufficiently Active (07/07/2024)  Social Connections: Unknown (07/07/2024)  Tobacco Use: Low Risk (07/11/2024)   Social History[2]   Most Recent Fall Risk Assessment:    07/11/2024   11:05 AM  Fall Risk   Falls in the past year? 0  Number falls in past yr: 0  Injury with Fall? 0  Risk for fall due to : No Fall Risks  Follow up Falls prevention discussed;Education provided;Falls evaluation completed   Most Recent Anxiety/Depression Screenings:    06/27/2024   10:37 AM 06/22/2023   11:13 AM  PHQ 2/9 Scores  PHQ - 2 Score 0 0     Results:  Studies Obtained And Personally Reviewed By Me:   06/29/2023 Mammogram No mammographic evidence of malignancy. Repeat in one year.    07/04/2023 Colonoscopy The entire examined colon is normal. The examined portion of the ileum was normal. No specimens collected. Repeat in 10 years.    Labs:  CBC w/ Differential Lab Results  Component Value Date   WBC 3.1 (L) 07/10/2024   RBC 5.18 (H) 07/10/2024   HGB 14.5 07/10/2024   HCT 43.2 07/10/2024   PLT 202 07/10/2024   MCV 83.4 07/10/2024   MCH 28.0 07/10/2024   MCHC 33.6 07/10/2024   RDW 12.4 07/10/2024   MPV 11.3 07/10/2024   LYMPHSABS 1.2 02/01/2024   MONOABS 0.2 03/21/2021   BASOSABS 40 07/10/2024    Comprehensive Metabolic Panel Lab Results  Component Value Date   NA 140 07/10/2024   K 4.5 07/10/2024  CL 101 07/10/2024   CO2 30 07/10/2024   GLUCOSE 90 07/10/2024   BUN 13 07/10/2024   CREATININE 0.79 07/10/2024   CALCIUM  10.0 07/10/2024   PROT 7.6 07/10/2024   ALBUMIN 4.5  02/01/2024   AST 15 07/10/2024   ALT 12 07/10/2024   ALKPHOS 144 (H) 02/01/2024   BILITOT 1.0 07/10/2024   EGFR 85 07/10/2024   GFRNONAA >60 03/21/2021   Lipid Panel  Lab Results  Component Value Date   CHOL 183 07/10/2024   HDL 72 07/10/2024   LDLCALC 92 07/10/2024   TRIG 99 07/10/2024   TSH Lab Results  Component Value Date   TSH 1.30 07/10/2024    Assessment & Plan:   Migraine Headaches: treated with 10 mg Maxalt  PRN.   Hyperlipidemia: treated with 5 mg Rosuvastatin  daily. 07/10/2024 Lipid panel.   Vitamin D  deficiency: treated with Dridol 1.25 mg weekly.    Allergic rhinitis: Was seen by Dr. Orlan on 02/08/2024. Blood (CBC diff, CMP, TSH, ANA, ESR, C1 inh Level and function, Tryptase, Alpha gal, SPEP, UPEPE) tests were normal.   06/29/2023 Mammogram No mammographic evidence of malignancy. Repeat in one year.    07/04/2023 Colonoscopy The entire examined colon is normal. The examined portion of the ileum was normal. No specimens collected. Repeat in 10 years.   Health Maintenance: Mammogram scheduled on Monday.     Annual Comprehensive Physical Exam done today including the all of the following: Reviewed patient's Family Medical History Reviewed patient's SDOH and reviewed tobacco, alcohol, and drug use.  Reviewed and updated list of patient's medical providers Assessment of cognitive impairment was done Assessed patient's functional ability Established a written schedule for health screening services Health Risk Assessent Completed and Reviewed  Discussed health benefits of physical activity, and encouraged her to engage in regular exercise appropriate for her age and condition.   I,Makayla C Reid,acting as a scribe for Ronal JINNY Hailstone, MD.,have documented all relevant documentation on the behalf of Ronal JINNY Hailstone, MD,as directed by  Ronal JINNY Hailstone, MD while in the presence of Ronal JINNY Hailstone, MD.  I, Ronal JINNY Hailstone, MD, have reviewed all documentation for and agree  with the above Annual Wellness Visit documentation.  Ronal JINNY Hailstone, MD Internal Medicine 07/11/2024     [1]  Allergies Allergen Reactions   Nsaids     Possible erythema with throat tightness  [2]  Social History Tobacco Use   Smoking status: Never   Smokeless tobacco: Never  Vaping Use   Vaping status: Never Used  Substance Use Topics   Alcohol use: Yes   Drug use: Never   "

## 2024-06-27 NOTE — Progress Notes (Signed)
 "   Patient Care Team: [name], MD as PCP - General (Internal Medicine)  Visit Date: 06/27/24  Subjective:    Patient ID: Carrie Mosley , Female   DOB: November 06, 1960, 64 y.o.    MRN: [id number]   64 y.o. Female presents today for  complaint of inus congestion. Patient has a past medical history of Migraine Headaches, HLD, and Vitamin-D Deficiency .  Recently went on a trip to [place] and came back January 20th. She is unsure if she became ill  in [place] or while traveling on the plane to [place]. She is experiencing cough, sinus congestion, and rhinorrhea.  She also experienced diarrhea  she believes it  was perhaps due to some food that she consumed that she treated Imodium. She took some Prednisone  but said that her symptoms did not improve.    Past Medical History:  Diagnosis Date   Angio-edema    Menopause    Migraine    Urticaria    Varicose veins    Vitamin D  deficiency      Family History  Problem Relation Age of Onset   Heart disease Father    Diabetes Father    Diabetes Brother    Breast cancer Maternal Aunt    Family History Narrative: Mother died of complications of Diabetes with Septicemia.  Father died of heart failure in his 53s with history of Diabetes and Coronary Artery Disease. No sisters.      Social history Narrative:  Single, never married. No children. Rarely drinks alcohol. Does not smoke. 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      Review of Systems  HENT:  Positive for congestion and nosebleeds.   Respiratory:  Positive for cough.         Objective:   Vitals: BP 120/70   Pulse 84   Temp 98.1 F (36.7 C)   Ht 5' 5 (1.651 m)   Wt 159 lb (72.1 kg)   LMP 06/05/2010   SpO2 98%   BMI 26.46 kg/m    Physical Exam Vitals and nursing note reviewed.  Constitutional:      General: She is not in acute distress.    Appearance: Normal appearance. She is not  ill-appearing.  HENT:     Head: Normocephalic and atraumatic.     Right Ear: Tympanic membrane, ear canal and external ear normal.     Left Ear: Tympanic membrane, ear canal and external ear normal.     Ears:     Comments: TM pink     Nose:     Comments: Boggy nasal mucosa bilaterally.     Mouth/Throat:     Mouth: Mucous membranes are moist.     Pharynx: Oropharynx is clear. No oropharyngeal exudate or posterior oropharyngeal erythema.  Pulmonary:     Effort: Pulmonary effort is normal.     Breath sounds: Normal breath sounds. No wheezing, rhonchi or rales.  Lymphadenopathy:     Cervical: No cervical adenopathy.  Skin:    General: Skin is warm and dry.  Neurological:     Mental Status: She is alert and oriented to person, place, and time. Mental status is at baseline.  Psychiatric:        Mood and Affect: Mood normal.        Behavior: Behavior normal.        Thought Content: Thought content normal.  Judgment: Judgment normal.       Results:   Studies obtained and personally reviewed by me:  Labs:       Component Value Date/Time   NA 140 02/01/2024 1446   K 3.9 02/01/2024 1446   CL 101 02/01/2024 1446   CO2 23 02/01/2024 1446   GLUCOSE 82 02/01/2024 1446   GLUCOSE 81 06/22/2023 1008   BUN 9 02/01/2024 1446   CREATININE 0.88 02/01/2024 1446   CREATININE 0.86 06/22/2023 1008   CALCIUM  10.1 02/01/2024 1446   PROT 7.7 02/01/2024 1446   ALBUMIN 4.5 02/01/2024 1446   AST 17 02/01/2024 1446   ALT 12 02/01/2024 1446   ALKPHOS 144 (H) 02/01/2024 1446   BILITOT 0.8 02/01/2024 1446   GFRNONAA >60 03/21/2021 1445   GFRNONAA 80 04/07/2019 1000   GFRAA 93 04/07/2019 1000     Lab Results  Component Value Date   WBC 3.5 02/01/2024   HGB 14.9 02/01/2024   HCT 44.5 02/01/2024   MCV 85 02/01/2024   PLT 213 02/01/2024    Lab Results  Component Value Date   CHOL 185 06/22/2023   HDL 76 06/22/2023   LDLCALC 94 06/22/2023   TRIG 66 06/22/2023   CHOLHDL 2.4  06/22/2023     Lab Results  Component Value Date   TSH 0.839 02/01/2024        Assessment & Plan:   Meds ordered this encounter  Medications   azithromycin  (ZITHROMAX ) 250 MG tablet    Sig: Take 2 tablets on day 1, then 1 tablet daily on days 2 through 5    Dispense:  6 tablet    Refill:  1   fluconazole  (DIFLUCAN ) 150 MG tablet    Sig: Take 1 tablet (150 mg total) by mouth once for 1 dose.    Dispense:  1 tablet    Refill:  0   methylPREDNISolone  acetate (DEPO-MEDROL ) injection 80 mg   cefTRIAXone  (ROCEPHIN ) injection 1 g   Acute Non-recurrent maxillary sinusitis: Recently traveled  to [place] and came back on January 20th. She is unsure if she became ill  in [place] or while traveling on the plane going to [place]. She is experiencing cough, sinus congestion and rhinorrhea.  She also experienced diarrhea perhaps traveler's diarrhea due to some food that she ate that she treated Immodium. She took some prednisone  that she had on hand but said that her symptoms did not improve.   Rocephin  1 gram IM Injection given today for acute sinusitis  Depo-medrol  80 mg IM injection received today. This should help respiratory congestion  Zithromax  Z pak  to take 2 tablets on day one, one tablet daily on days 2-5 prescribed. This should help sinusitis  Diflucan  150 mg prescribed if needed for Candida vaginitis.  I,Makayla C Reid,acting as a scribe for Ronal JINNY Hailstone, MD.,have documented all relevant documentation on the behalf of Ronal JINNY Hailstone, MD,as directed by  Ronal JINNY Hailstone, MD while in the presence of Ronal JINNY Hailstone, MD.  I, Ronal JINNY Hailstone, MD, have reviewed all documentation for this visit. The documentation on 06/27/2024 for the exam, diagnosis, procedures, and orders are all accurate and complete.  You have been   "

## 2024-07-01 NOTE — Patient Instructions (Signed)
 Diagnosed with acute maxillary sinusitis.  You were given 1 g IM Rocephin  (antibiotic) in office today for acute sinusitis.  Depo-Medrol  80 mg IM was given to help respiratory congestion.  Please take Zithromax  Z-PAK 2 tablets day 1 followed by 1 tablet days 2 through 5.  This is to treat acute sinusitis.  This is an antibiotic.  Please take Diflucan  150 mg tablet if needed for Candida vaginitis due to being on antibiotics.  Rest and stay well-hydrated.  Call if not improving within a few days.

## 2024-07-10 ENCOUNTER — Ambulatory Visit: Admitting: Internal Medicine

## 2024-07-10 ENCOUNTER — Other Ambulatory Visit

## 2024-07-10 DIAGNOSIS — Z0000 Encounter for general adult medical examination without abnormal findings: Secondary | ICD-10-CM

## 2024-07-10 DIAGNOSIS — Z1329 Encounter for screening for other suspected endocrine disorder: Secondary | ICD-10-CM

## 2024-07-10 DIAGNOSIS — E7800 Pure hypercholesterolemia, unspecified: Secondary | ICD-10-CM

## 2024-07-10 DIAGNOSIS — K580 Irritable bowel syndrome with diarrhea: Secondary | ICD-10-CM

## 2024-07-11 ENCOUNTER — Encounter: Payer: Self-pay | Admitting: Internal Medicine

## 2024-07-11 ENCOUNTER — Ambulatory Visit: Admitting: Internal Medicine

## 2024-07-11 ENCOUNTER — Ambulatory Visit: Payer: Self-pay | Admitting: Internal Medicine

## 2024-07-11 VITALS — BP 110/70 | HR 75 | Ht 65.0 in | Wt 161.0 lb

## 2024-07-11 DIAGNOSIS — E7800 Pure hypercholesterolemia, unspecified: Secondary | ICD-10-CM

## 2024-07-11 DIAGNOSIS — Z8639 Personal history of other endocrine, nutritional and metabolic disease: Secondary | ICD-10-CM

## 2024-07-11 DIAGNOSIS — Z0000 Encounter for general adult medical examination without abnormal findings: Secondary | ICD-10-CM

## 2024-07-11 DIAGNOSIS — Z8669 Personal history of other diseases of the nervous system and sense organs: Secondary | ICD-10-CM

## 2024-07-11 LAB — COMPREHENSIVE METABOLIC PANEL WITH GFR
AG Ratio: 1.2 (calc) (ref 1.0–2.5)
ALT: 12 U/L (ref 6–29)
AST: 15 U/L (ref 10–35)
Albumin: 4.2 g/dL (ref 3.6–5.1)
Alkaline phosphatase (APISO): 120 U/L (ref 37–153)
BUN: 13 mg/dL (ref 7–25)
CO2: 30 mmol/L (ref 20–32)
Calcium: 10 mg/dL (ref 8.6–10.4)
Chloride: 101 mmol/L (ref 98–110)
Creat: 0.79 mg/dL (ref 0.50–1.05)
Globulin: 3.4 g/dL (ref 1.9–3.7)
Glucose, Bld: 90 mg/dL (ref 65–99)
Potassium: 4.5 mmol/L (ref 3.5–5.3)
Sodium: 140 mmol/L (ref 135–146)
Total Bilirubin: 1 mg/dL (ref 0.2–1.2)
Total Protein: 7.6 g/dL (ref 6.1–8.1)
eGFR: 85 mL/min/{1.73_m2}

## 2024-07-11 LAB — LIPID PANEL
Cholesterol: 183 mg/dL
HDL: 72 mg/dL
LDL Cholesterol (Calc): 92 mg/dL
Non-HDL Cholesterol (Calc): 111 mg/dL
Total CHOL/HDL Ratio: 2.5 (calc)
Triglycerides: 99 mg/dL

## 2024-07-11 LAB — CBC WITH DIFFERENTIAL/PLATELET
Absolute Lymphocytes: 1011 {cells}/uL (ref 850–3900)
Absolute Monocytes: 202 {cells}/uL (ref 200–950)
Basophils Absolute: 40 {cells}/uL (ref 0–200)
Basophils Relative: 1.3 %
Eosinophils Absolute: 112 {cells}/uL (ref 15–500)
Eosinophils Relative: 3.6 %
HCT: 43.2 % (ref 35.9–46.0)
Hemoglobin: 14.5 g/dL (ref 11.7–15.5)
MCH: 28 pg (ref 27.0–33.0)
MCHC: 33.6 g/dL (ref 31.6–35.4)
MCV: 83.4 fL (ref 81.4–101.7)
MPV: 11.3 fL (ref 7.5–12.5)
Monocytes Relative: 6.5 %
Neutro Abs: 1736 {cells}/uL (ref 1500–7800)
Neutrophils Relative %: 56 %
Platelets: 202 10*3/uL (ref 140–400)
RBC: 5.18 Million/uL — ABNORMAL HIGH (ref 3.80–5.10)
RDW: 12.4 % (ref 11.0–15.0)
Total Lymphocyte: 32.6 %
WBC: 3.1 10*3/uL — ABNORMAL LOW (ref 3.8–10.8)

## 2024-07-11 LAB — POCT URINALYSIS DIP (CLINITEK)
Bilirubin, UA: NEGATIVE
Blood, UA: NEGATIVE
Glucose, UA: NEGATIVE mg/dL
Ketones, POC UA: NEGATIVE mg/dL
Leukocytes, UA: NEGATIVE
Nitrite, UA: NEGATIVE
POC PROTEIN,UA: NEGATIVE
Spec Grav, UA: 1.01
Urobilinogen, UA: 0.2 U/dL
pH, UA: 6.5

## 2024-07-11 LAB — TSH: TSH: 1.3 m[IU]/L (ref 0.40–4.50)

## 2024-07-11 NOTE — Patient Instructions (Addendum)
 Labs reviewed. Situation with Allergy evaluation discussed. Reviewed some of the Allergy testing results. Lipid panel is normal on low dose Crestor  5 mg daily. Bone density and Pap to be done by GYN physician. Have annual mammogram.

## 2024-07-14 ENCOUNTER — Ambulatory Visit
